# Patient Record
Sex: Male | Born: 1970 | Race: White | Hispanic: No | Marital: Married | Smoking: Former smoker
Health system: Southern US, Community
[De-identification: ages and names within clinical notes are randomized; demographics above are authoritative.]

## PROBLEM LIST (undated history)

## (undated) DIAGNOSIS — I10 Essential (primary) hypertension: Secondary | ICD-10-CM

## (undated) DIAGNOSIS — K219 Gastro-esophageal reflux disease without esophagitis: Secondary | ICD-10-CM

## (undated) HISTORY — DX: Gastro-esophageal reflux disease without esophagitis: K21.9

## (undated) HISTORY — DX: Essential (primary) hypertension: I10

## (undated) HISTORY — PX: HERNIA REPAIR: SHX51

## (undated) HISTORY — PX: GALLBLADDER SURGERY: SHX652

---

## 2019-01-22 DIAGNOSIS — Z125 Encounter for screening for malignant neoplasm of prostate: Secondary | ICD-10-CM | POA: Insufficient documentation

## 2019-01-22 DIAGNOSIS — I10 Essential (primary) hypertension: Secondary | ICD-10-CM | POA: Insufficient documentation

## 2019-02-12 DIAGNOSIS — J22 Unspecified acute lower respiratory infection: Secondary | ICD-10-CM | POA: Insufficient documentation

## 2019-08-19 NOTE — Progress Notes (Signed)
 [facility] [place] [address] [place] [address] Follow up on chronic medical problem   Ross Williams is a 48 y.o. male DOB: 12-05-1970   Subjective  [name]  presents for follow up visit on chronic medical problem  Chief Complaint  Patient presents with  . Medication Management    needs refill on BP med/ denies chest pain/sob/HA's  . left groin pain    left groin discomfort x 6 weeks, notice some swelling   HPI HYPERTENSION  No chest pains, sob, dizziness, pedal edema, palpitations.  On olmesartan  20 mg without ill side effects.   LEFT INGUINAL HERNIA  Heavy lifting at work.  Noticed lump along left inguinal region six weeks prior.   Not tender.  To touch but sometimes does feel discomfort.  Not sure if it reduces while laying down.  No testicular swelling or pain. No abdominal pain, no changes in bowel habits.   No prior issues with hernia.   Severity : Mild  Quality:  __________________________________________________________________  No past medical history on file.  Past Surgical History:  Procedure Laterality Date  . NO PAST SURGERIES      Social History   Socioeconomic History  . Marital status: Married    Spouse name: None  . Number of children: None  . Years of education: None  . Highest education level: None  Occupational History  . None  Social Needs  . Financial resource strain: None  . Food insecurity    Worry: None    Inability: None  . Transportation needs    Medical: None    Non-medical: None  Tobacco Use  . Smoking status: Current Every Day Smoker    Packs/day: 0.25    Types: Cigarettes  . Smokeless tobacco: Never Used  Substance and Sexual Activity  . Alcohol use: Never    Frequency: Never  . Drug use: Never  . Sexual activity: Yes    Partners: Female  Lifestyle  . Physical activity    Days per week: None    Minutes per session: None  . Stress: None   Relationships  . Social Musician on phone: None    Gets together: None    Attends religious service: None    Active member of club or organization: None    Attends meetings of clubs or organizations: None    Relationship status: None  Other Topics Concern  . None  Social History Narrative  . None    Family History  Adopted: Yes   ___________________________________________________________________  Review of Systems - all systems negative with the exception of what is listed in the HPI  No Known Allergies   Current Outpatient Medications:  .  olmesartan  (BENICAR ) 20 MG tablet, Take 1 tablet (20 mg total) by mouth daily., Disp: 90 tablet, Rfl: 3  Objective   Vitals:   08/19/19 0807 08/19/19 0816  BP: 118/70 118/70  Pulse: 76   Temp: 98.7 F (37.1 C)   Resp: 18   SpO2: 97%   Height: 1.727 m (5' 8)   Weight: 79.8 kg (176 lb)   BMI (Calculated): 26.8      GENERAL APPEARANCE:  Well developed, well groomed. No acute distress. Color good. MENTAL STATUS: Appears alert and oriented.  SKIN: No suspicious lesions, masses, rashes, or ulcerations.  HEAD: Normocephalic. EYES: PERRL, EOMI. Lids w/o defect, conjunctiva and sclera appear normal.  CHEST: Respirations unlabored with normal diaphragmatic excursion. Chest  wall symmetric with no masses. Breath sounds clear bilaterally w/o wheezes, rubs, rales, or rhonchi.  HEART: RRR no murmurs rubs or gallops.  ABDOMEN: Abdomen soft with normal bowel sounds. No guarding or rebound. No palpable masses or tenderness. GU: left inguinal region hernia.  Reducible and with some discomfort on palpation.  No lymphadenitis.   NEURO: Cranial nerves grossly intact.    Assessment/Plan   1. Essential hypertension    2. Left groin hernia  Ambulatory referral to General Surgery    Patient Instructions  HYPERTENSION  - bp at goal - continue current meds at current dose - watch sodium intake.   - avoid excessive caffeine  intake  - will follow up for med management in six months.  LEFT INGUINAL HERNIA  Will refer to general surgery for further evaluation    Return for follow up in six months for CPE after fasting labs.  .       Electronically signed by: Alberteen Hunter Raddle., PA-C 08/19/19 [phone]

## 2019-08-31 DIAGNOSIS — Z0000 Encounter for general adult medical examination without abnormal findings: Secondary | ICD-10-CM | POA: Insufficient documentation

## 2019-08-31 DIAGNOSIS — K4090 Unilateral inguinal hernia, without obstruction or gangrene, not specified as recurrent: Secondary | ICD-10-CM | POA: Insufficient documentation

## 2019-08-31 DIAGNOSIS — R1032 Left lower quadrant pain: Secondary | ICD-10-CM | POA: Insufficient documentation

## 2019-10-25 DIAGNOSIS — Z09 Encounter for follow-up examination after completed treatment for conditions other than malignant neoplasm: Secondary | ICD-10-CM | POA: Insufficient documentation

## 2020-02-18 NOTE — Progress Notes (Signed)
 [facility] [place] [address] [place] [address] Health maintenance  Ross Williams is a 49 y.o. male DOB: 10-Dec-1970   Subjective  Ross Williams  presents for health maintenance   Chief Complaint  Patient presents with  . Annual Exam    Declines TDAP, Colonoscopy   HPI - sleep: stable - mood: stable - diet: unrestricted  - vaccinations: declines tdap.  - exercise: not regularly  - eye exam: up to date  - dentist: up to date  - colon screening: colonoscopy.  Declined.   - prostate exam: psa up to date. Normal.   - concerns: none  __________________________________________________________________  No past medical history on file.  Past Surgical History:  Procedure Laterality Date  . HERNIA REPAIR     left groin  . NO PAST SURGERIES    . Thoracic surgery as a child for trauma      Social History   Socioeconomic History  . Marital status: Married    Spouse name: None  . Number of children: None  . Years of education: None  . Highest education level: None  Occupational History  . None  Tobacco Use  . Smoking status: Current Every Day Smoker    Packs/day: 0.25    Types: Cigarettes  . Smokeless tobacco: Never Used  Substance and Sexual Activity  . Alcohol use: Never  . Drug use: Never  . Sexual activity: Yes    Partners: Female  Other Topics Concern  . None  Social History Narrative  . None   Social Determinants of Health   Financial Resource Strain:   . Difficulty of Paying Living Expenses:   Food Insecurity:   . Worried About Programme researcher, broadcasting/film/video in the Last Year:   . Barista in the Last Year:   Transportation Needs:   . Freight forwarder (Medical):   SABRA Lack of Transportation (Non-Medical):   Physical Activity:   . Days of Exercise per Week:   . Minutes of Exercise per Session:   Stress:   . Feeling of Stress :   Social Connections:   . Frequency of Communication  with Friends and Family:   . Frequency of Social Gatherings with Friends and Family:   . Attends Religious Services:   . Active Member of Clubs or Organizations:   . Attends Banker Meetings:   SABRA Marital Status:     Family History  Adopted: Yes   ___________________________________________________________________  Review of Systems - all systems negative with the exception of what is listed in the HPI  No Known Allergies   Current Outpatient Medications:  .  ibuprofen  (ADVIL ,MOTRIN ) 600 MG tablet, TAKE 1 TABLET BY MOUTH EVERY 8 HOURS AS NEEDED FOR PAIN, Disp: 30 tablet, Rfl: 11 .  olmesartan  (BENICAR ) 20 MG tablet, Take 1 tablet (20 mg total) by mouth daily., Disp: 90 tablet, Rfl: 3  Objective   Vitals:   02/18/20 0832  BP: 148/74  Pulse: 82  Temp: 98.2 F (36.8 C)  Resp: 18  SpO2: 97%  Height: 1.727 m (5' 8)  Weight: 80.3 kg (177 lb)  BMI (Calculated): 27     GENERAL APPEARANCE:  Well developed, well groomed. No acute distress. Color good. MENTAL STATUS: Appears alert and oriented. Affect appropriate. No manic s/s.  Mood normal.   SKIN: No suspicious lesions, masses, rashes, or ulcerations.  HEAD: Normocephalic. EARS: External ear nml. External auditory canal intact, clear,  and w/o lesions. TMs intact with normal light reflex and landmarks. Acuity to conversational tones good.  EYES: PERRL, EOMI. Lids w/o defect, conjunctiva and sclera appear normal.  NOSE: Nasal mucosa and turbinates pink, no lesions. OROPHARYNX: Moist w/o exudate, erythema, or swelling.  Gums pink w/o lesions. Normal appearing mucosa, palate, and tongue.  CHEST: Respirations unlabored with normal diaphragmatic excursion. Chest wall symmetric with no masses. Breath sounds clear bilaterally w/o wheezes, rubs, rales, or rhonchi.  HEART: RRR no murmurs rubs or gallops.  ABDOMEN: Abdomen soft with normal bowel sounds. No guarding or rebound. No palpable masses or tenderness. VASC:  No  edema/ no cyanosis. NEURO: Cranial nerves grossly intact. No tremors, gait normal. PERRL, EOM intact. Alert and oriented.  No obvious motor function deficits. Normal DTR bilateral  Assessment/Plan   1. Healthcare maintenance      Patient Instructions  WELLNESS - education provided - sex/age based - vaccinations: recommendations: declines tdap  - cancer screening recommendations: colon screening declined.  psa in normal range, follow year.   QUESTIONS ANSWERED - next wellness in one year - fasting labs today: reviewed      Return in about 1 year (around 02/17/2021).       Electronically signed by: Alberteen Hunter Raddle., PA-C 02/20/20 1720

## 2020-08-07 ENCOUNTER — Emergency Department (HOSPITAL_COMMUNITY): Payer: Worker's Compensation

## 2020-08-07 ENCOUNTER — Ambulatory Visit (HOSPITAL_COMMUNITY)
Admission: EM | Admit: 2020-08-07 | Discharge: 2020-08-08 | Disposition: A | Discharge status: Home / Self Care | Payer: Worker's Compensation | Attending: Emergency Medicine | Admitting: Emergency Medicine

## 2020-08-07 ENCOUNTER — Encounter (HOSPITAL_COMMUNITY)
Admission: EM | Disposition: A | Discharge status: Home / Self Care | Payer: Self-pay | Source: Home / Self Care | Attending: Emergency Medicine

## 2020-08-07 ENCOUNTER — Emergency Department (HOSPITAL_COMMUNITY): Payer: Worker's Compensation | Admitting: Anesthesiology

## 2020-08-07 DIAGNOSIS — W3189XA Contact with other specified machinery, initial encounter: Secondary | ICD-10-CM | POA: Insufficient documentation

## 2020-08-07 DIAGNOSIS — S68622A Partial traumatic transphalangeal amputation of right middle finger, initial encounter: Secondary | ICD-10-CM | POA: Diagnosis not present

## 2020-08-07 DIAGNOSIS — Y990 Civilian activity done for income or pay: Secondary | ICD-10-CM | POA: Insufficient documentation

## 2020-08-07 DIAGNOSIS — S6991XA Unspecified injury of right wrist, hand and finger(s), initial encounter: Secondary | ICD-10-CM

## 2020-08-07 DIAGNOSIS — S68624A Partial traumatic transphalangeal amputation of right ring finger, initial encounter: Secondary | ICD-10-CM | POA: Diagnosis not present

## 2020-08-07 DIAGNOSIS — S62636B Displaced fracture of distal phalanx of right little finger, initial encounter for open fracture: Secondary | ICD-10-CM | POA: Diagnosis not present

## 2020-08-07 DIAGNOSIS — Z23 Encounter for immunization: Secondary | ICD-10-CM | POA: Diagnosis not present

## 2020-08-07 DIAGNOSIS — Z20822 Contact with and (suspected) exposure to covid-19: Secondary | ICD-10-CM | POA: Diagnosis not present

## 2020-08-07 DIAGNOSIS — Z419 Encounter for procedure for purposes other than remedying health state, unspecified: Secondary | ICD-10-CM

## 2020-08-07 DIAGNOSIS — T07XXXA Unspecified multiple injuries, initial encounter: Secondary | ICD-10-CM

## 2020-08-07 HISTORY — PX: AMPUTATION: SHX166

## 2020-08-07 HISTORY — PX: I & D EXTREMITY: SHX5045

## 2020-08-07 HISTORY — PX: FINGER REPLANTATION: SHX639

## 2020-08-07 LAB — PROTIME-INR
INR: 1 (ref 0.8–1.2)
Prothrombin Time: 13.1 seconds (ref 11.4–15.2)

## 2020-08-07 LAB — CBC WITH DIFFERENTIAL/PLATELET
Abs Immature Granulocytes: 0.03 10*3/uL (ref 0.00–0.07)
Basophils Absolute: 0.1 10*3/uL (ref 0.0–0.1)
Basophils Relative: 1 %
Eosinophils Absolute: 0 10*3/uL (ref 0.0–0.5)
Eosinophils Relative: 0 %
HCT: 44.6 % (ref 39.0–52.0)
Hemoglobin: 15.5 g/dL (ref 13.0–17.0)
Immature Granulocytes: 0 %
Lymphocytes Relative: 12 %
Lymphs Abs: 1.3 10*3/uL (ref 0.7–4.0)
MCH: 33.6 pg (ref 26.0–34.0)
MCHC: 34.8 g/dL (ref 30.0–36.0)
MCV: 96.7 fL (ref 80.0–100.0)
Monocytes Absolute: 0.7 10*3/uL (ref 0.1–1.0)
Monocytes Relative: 6 %
Neutro Abs: 8.4 10*3/uL — ABNORMAL HIGH (ref 1.7–7.7)
Neutrophils Relative %: 81 %
Platelets: 257 10*3/uL (ref 150–400)
RBC: 4.61 MIL/uL (ref 4.22–5.81)
RDW: 13.2 % (ref 11.5–15.5)
WBC: 10.4 10*3/uL (ref 4.0–10.5)
nRBC: 0 % (ref 0.0–0.2)

## 2020-08-07 LAB — BASIC METABOLIC PANEL
Anion gap: 10 (ref 5–15)
BUN: 13 mg/dL (ref 6–20)
CO2: 27 mmol/L (ref 22–32)
Calcium: 9.4 mg/dL (ref 8.9–10.3)
Chloride: 104 mmol/L (ref 98–111)
Creatinine, Ser: 1.24 mg/dL (ref 0.61–1.24)
GFR calc Af Amer: >60 mL/min (ref >60)
GFR calc non Af Amer: >60 mL/min (ref >60)
Glucose, Bld: 103 mg/dL — ABNORMAL HIGH (ref 70–99)
Potassium: 4.2 mmol/L (ref 3.5–5.1)
Sodium: 141 mmol/L (ref 135–145)

## 2020-08-07 LAB — SARS CORONAVIRUS 2 BY RT PCR (HOSPITAL ORDER, PERFORMED IN ~~LOC~~ HOSPITAL LAB): SARS Coronavirus 2: NEGATIVE

## 2020-08-07 LAB — TYPE AND SCREEN
ABO/RH(D): B POS
Antibody Screen: NEGATIVE

## 2020-08-07 SURGERY — IRRIGATION AND DEBRIDEMENT EXTREMITY
Anesthesia: General | Site: Hand | Laterality: Right

## 2020-08-07 MED ORDER — SODIUM CHLORIDE 0.9 % IR SOLN
Status: DC | PRN
  Administered 2020-08-07: 3000 mL

## 2020-08-07 MED ORDER — 0.9 % SODIUM CHLORIDE (POUR BTL) OPTIME
TOPICAL | Status: DC | PRN
  Administered 2020-08-07: 1000 mL

## 2020-08-07 MED ORDER — GENTAMICIN IN SALINE 1.6-0.9 MG/ML-% IV SOLN
80.0000 mg | INTRAVENOUS | Status: AC
  Administered 2020-08-07: 80 mg via INTRAVENOUS
  Filled 2020-08-07: qty 50

## 2020-08-07 MED ORDER — FENTANYL CITRATE (PF) 250 MCG/5ML IJ SOLN
INTRAMUSCULAR | Status: DC | PRN
  Administered 2020-08-07: 50 ug via INTRAVENOUS
  Administered 2020-08-07 (×2): 100 ug via INTRAVENOUS

## 2020-08-07 MED ORDER — SODIUM CHLORIDE 0.9 % IV BOLUS
500.0000 mL | Freq: Once | INTRAVENOUS | Status: AC
  Administered 2020-08-07: 500 mL via INTRAVENOUS

## 2020-08-07 MED ORDER — LACTATED RINGERS IV SOLN: INTRAVENOUS | Status: DC | PRN

## 2020-08-07 MED ORDER — TETANUS-DIPHTH-ACELL PERTUSSIS 5-2.5-18.5 LF-MCG/0.5 IM SUSP
0.5000 mL | Freq: Once | INTRAMUSCULAR | Status: AC
  Administered 2020-08-07: 0.5 mL via INTRAMUSCULAR
  Filled 2020-08-07: qty 0.5

## 2020-08-07 MED ORDER — BUPIVACAINE-EPINEPHRINE 0.5% -1:200000 IJ SOLN
INTRAMUSCULAR | Status: AC
  Filled 2020-08-07: qty 1

## 2020-08-07 MED ORDER — ONDANSETRON HCL 4 MG/2ML IJ SOLN
4.0000 mg | Freq: Once | INTRAMUSCULAR | Status: AC
  Administered 2020-08-07: 4 mg via INTRAVENOUS
  Filled 2020-08-07: qty 2

## 2020-08-07 MED ORDER — BUPIVACAINE HCL (PF) 0.25 % IJ SOLN
INTRAMUSCULAR | Status: AC
  Filled 2020-08-07: qty 30

## 2020-08-07 MED ORDER — BUPIVACAINE HCL (PF) 0.5 % IJ SOLN
INTRAMUSCULAR | Status: AC
  Filled 2020-08-07: qty 30

## 2020-08-07 MED ORDER — METRONIDAZOLE IN NACL 5-0.79 MG/ML-% IV SOLN
500.0000 mg | Freq: Once | INTRAVENOUS | Status: AC
  Administered 2020-08-07: 500 mg via INTRAVENOUS
  Filled 2020-08-07: qty 100

## 2020-08-07 MED ORDER — FENTANYL CITRATE (PF) 250 MCG/5ML IJ SOLN
INTRAMUSCULAR | Status: AC
  Filled 2020-08-07: qty 5

## 2020-08-07 MED ORDER — CEFAZOLIN SODIUM-DEXTROSE 2-4 GM/100ML-% IV SOLN
2.0000 g | Freq: Once | INTRAVENOUS | Status: AC
  Administered 2020-08-07: 2 g via INTRAVENOUS
  Filled 2020-08-07: qty 100

## 2020-08-07 MED ORDER — MIDAZOLAM HCL 2 MG/2ML IJ SOLN
INTRAMUSCULAR | Status: AC
  Filled 2020-08-07: qty 2

## 2020-08-07 MED ORDER — STERILE WATER FOR IRRIGATION IR SOLN
Status: DC | PRN
  Administered 2020-08-07: 1000 mL

## 2020-08-07 MED ORDER — MIDAZOLAM HCL 5 MG/5ML IJ SOLN
INTRAMUSCULAR | Status: DC | PRN
  Administered 2020-08-07: 2 mg via INTRAVENOUS

## 2020-08-07 MED ORDER — PROPOFOL 1000 MG/100ML IV EMUL
INTRAVENOUS | Status: AC
  Filled 2020-08-07: qty 100

## 2020-08-07 MED ORDER — PROPOFOL 10 MG/ML IV BOLUS
INTRAVENOUS | Status: DC | PRN
  Administered 2020-08-07: 200 mg via INTRAVENOUS

## 2020-08-07 MED ORDER — LIDOCAINE HCL (CARDIAC) PF 100 MG/5ML IV SOSY
PREFILLED_SYRINGE | INTRAVENOUS | Status: DC | PRN
  Administered 2020-08-07: 60 mg via INTRATRACHEAL

## 2020-08-07 MED ORDER — EPHEDRINE SULFATE 50 MG/ML IJ SOLN
INTRAMUSCULAR | Status: DC | PRN
  Administered 2020-08-07: 5 mg via INTRAVENOUS
  Administered 2020-08-07: 10 mg via INTRAVENOUS
  Administered 2020-08-07: 5 mg via INTRAVENOUS

## 2020-08-07 MED ORDER — MORPHINE SULFATE (PF) 4 MG/ML IV SOLN
4.0000 mg | Freq: Once | INTRAVENOUS | Status: AC
  Administered 2020-08-07: 4 mg via INTRAVENOUS
  Filled 2020-08-07: qty 1

## 2020-08-07 MED ORDER — PROPOFOL 10 MG/ML IV BOLUS
INTRAVENOUS | Status: AC
  Filled 2020-08-07: qty 20

## 2020-08-07 MED ORDER — PHENYLEPHRINE HCL (PRESSORS) 10 MG/ML IV SOLN
INTRAVENOUS | Status: DC | PRN
  Administered 2020-08-07 – 2020-08-08 (×2): 40 ug via INTRAVENOUS

## 2020-08-07 MED ORDER — SUCCINYLCHOLINE CHLORIDE 20 MG/ML IJ SOLN
INTRAMUSCULAR | Status: DC | PRN
  Administered 2020-08-07: 120 mg via INTRAVENOUS

## 2020-08-07 MED ORDER — HYDROMORPHONE HCL 1 MG/ML IJ SOLN
1.0000 mg | INTRAMUSCULAR | Status: DC | PRN
  Administered 2020-08-07: 1 mg via INTRAVENOUS
  Filled 2020-08-07: qty 1

## 2020-08-07 SURGICAL SUPPLY — 32 items
BLADE SURG 15 STRL LF DISP TIS (BLADE) ×2 IMPLANT
BLADE SURG 15 STRL SS (BLADE) ×2
BNDG COHESIVE 4X5 TAN STRL (GAUZE/BANDAGES/DRESSINGS) ×4 IMPLANT
BNDG GAUZE ELAST 4 BULKY (GAUZE/BANDAGES/DRESSINGS) ×8 IMPLANT
CORD BIPOLAR FORCEPS 12FT (ELECTRODE) ×4 IMPLANT
COVER WAND RF STERILE (DRAPES) ×4 IMPLANT
DRAPE SURG 17X23 STRL (DRAPES) ×4 IMPLANT
GAUZE SPONGE 4X4 12PLY STRL (GAUZE/BANDAGES/DRESSINGS) ×4 IMPLANT
GAUZE XEROFORM 1X8 LF (GAUZE/BANDAGES/DRESSINGS) ×4 IMPLANT
GLOVE BIO SURGEON STRL SZ7.5 (GLOVE) ×4 IMPLANT
GLOVE BIOGEL PI IND STRL 7.0 (GLOVE) ×2 IMPLANT
GLOVE BIOGEL PI IND STRL 8 (GLOVE) ×2 IMPLANT
GLOVE BIOGEL PI INDICATOR 7.0 (GLOVE) ×2
GLOVE BIOGEL PI INDICATOR 8 (GLOVE) ×2
GLOVE ECLIPSE 6.5 STRL STRAW (GLOVE) ×4 IMPLANT
GOWN STRL REUS W/ TWL LRG LVL3 (GOWN DISPOSABLE) ×4 IMPLANT
GOWN STRL REUS W/ TWL XL LVL3 (GOWN DISPOSABLE) ×2 IMPLANT
GOWN STRL REUS W/TWL LRG LVL3 (GOWN DISPOSABLE) ×4
GOWN STRL REUS W/TWL XL LVL3 (GOWN DISPOSABLE) ×2
K-WIRE DBL TROCAR .045X4 (WIRE) ×4
KIT BASIN OR (CUSTOM PROCEDURE TRAY) ×4 IMPLANT
KWIRE DBL TROCAR .045X4 (WIRE) ×2 IMPLANT
NS IRRIG 1000ML POUR BTL (IV SOLUTION) ×4 IMPLANT
PACK ORTHO EXTREMITY (CUSTOM PROCEDURE TRAY) ×4 IMPLANT
PAD CAST 4YDX4 CTTN HI CHSV (CAST SUPPLIES) ×4 IMPLANT
PADDING CAST COTTON 4X4 STRL (CAST SUPPLIES) ×4
SET IRRIG Y TYPE TUR BLADDER L (SET/KITS/TRAYS/PACK) ×4 IMPLANT
SOL PREP PROV IODINE SCRUB 4OZ (MISCELLANEOUS) ×4 IMPLANT
STOCKINETTE 6  STRL (DRAPES) ×2
STOCKINETTE 6 STRL (DRAPES) ×2 IMPLANT
SUT VICRYL RAPIDE 4/0 PS 2 (SUTURE) ×12 IMPLANT
UNDERPAD 30X36 HEAVY ABSORB (UNDERPADS AND DIAPERS) ×4 IMPLANT

## 2020-08-07 NOTE — ED Triage Notes (Signed)
Pt transported from work by [employer], pt sustained injury to R hand by equipment in [employer]. Partial degloving to ring and middle finger deep lacs to index finger. EMS applied bandage, bleeding controlled. No Tdap

## 2020-08-07 NOTE — Consult Note (Signed)
ORTHOPAEDIC CONSULTATION HISTORY & PHYSICAL REQUESTING PHYSICIAN: No att. providers found  Chief Complaint: right hand trauma  HPI: Ross Williams is a 49 y.o. 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  He was transported via EMS to [facility] ED where evaluation has begun.  Clinical photos had been placed in the chart and x-rays obtained.  PMHx- HTN PSHx-hernia rpr  Social History   Socioeconomic History   Marital status: Married    Spouse name: Not on file   Number of children: Not on file   Years of education: Not on file   Highest education level: Not on file  Occupational History   Not on file  Tobacco Use   Smoking status: Not on file  Substance and Sexual Activity   Alcohol use: Not on file   Drug use: Not on file   Sexual activity: Not on file  Other Topics Concern   Not on file  Social History Narrative   Not on file   Social Determinants of Health   Financial Resource Strain:    Difficulty of Paying Living Expenses: Not on file  Food Insecurity:    Worried About Running Out of Food in the Last Year: Not on file   Ran Out of Food in the Last Year: Not on file  Transportation Needs:    Lack of Transportation (Medical): Not on file   Lack of Transportation (Non-Medical): Not on file  Physical Activity:    Days of Exercise per Week: Not on file   Minutes of Exercise per Session: Not on file  Stress:    Feeling of Stress : Not on file  Social Connections:    Frequency of Communication with Friends and Family: Not on file   Frequency of Social Gatherings with Friends and Family: Not on file   Attends Religious Services: Not on file   Active Member of Clubs or Organizations: Not on file   Attends Banker Meetings: Not on file   Marital Status: Not on file   No family history on file. Not on File Prior to Admission medications   Not on  File   DG Hand Complete Right  Result Date: 08/07/2020 CLINICAL DATA:  Patient got there arm caught in a conveyor belt. EXAM: RIGHT HAND - COMPLETE 3+ VIEW COMPARISON:  None. FINDINGS: There are comminuted open fractures of the third digit distal phalanx which extends to the distal interphalangeal joint, fourth digit distal and middle phalanges which extend to the distal interphalangeal joint, and the fifth digit distal phalanx which does not extend to the joint space. There appears to be a wound of the distal second digit, however there is no definite fracture of the second digit. No definite radiopaque foreign bodies within the soft tissues of the hand. IMPRESSION: 1. Comminuted open fractures of the third, fourth, and fifth digits. Electronically Signed   By: Romona Curls M.D.   On: 08/07/2020 20:01     Positive ROS: All other systems have been reviewed and were otherwise negative with the exception of those mentioned in the HPI and as above.  Physical Exam: Vitals: Refer to EMR. Constitutional:  WD, WN, NAD HEENT:  NCAT, EOMI Neuro/Psych:  Alert & oriented to person, place, and time; appropriate mood & affect Lymphatic: No generalized extremity edema or lymphadenopathy Extremities / MSK:  The extremities are normal with respect to appearance,  ranges of motion, joint stability, muscle strength/tone, sensation, & perfusion except as otherwise noted:  There are a mangling injuries to the tips of the right long, ring, and small fingers, with near complete amputations of the long and ring.  The small finger appears injured to a lesser degree.  At the bedside there are bandages in place and it is not further examined as it will be examined in great detail under the appropriate degree of anesthesia  Assessment: Mangling injury to R LF/RF/SF tips with underlying comminuted fxs from workplace injury  Plan: To OR for revision amputation versus fingertip salvage of right LF, RF, & SF, depending upon  findings at operative exploration.  G/R/O reviewed and consent obtained and document executed.  Cliffton Asters Janee Morn, MD      Orthopaedic & Hand Surgery [facility] Sports Medicine [facility] [address] [place], [place]  [address] Office: [phone] Mobile: [phone]  08/07/2020, 10:58 PM

## 2020-08-07 NOTE — Discharge Instructions (Signed)
Discharge Instructions ° ° °You have a dressing with a splint incorporated in it. °Move your fingers as much as possible, making a full fist and fully opening the fist. °Elevate your hand to reduce pain & swelling of the digits.  Ice over the operative site may be helpful to reduce pain & swelling.  DO NOT USE HEAT. °Pain medicine has been prescribed for you.  °Leave the dressing in place until you return to our office.  °You may shower, but keep the bandage clean & dry.  °You may drive a car when you are off of prescription pain medications and can safely control your vehicle with both hands. °Our office will call you to arrange follow-up ° ° °Please call [phone] during normal business hours or [phone] after hours for any problems. Including the following: ° °- excessive redness of the incisions °- drainage for more than 4 days °- fever of more than 101.5 F ° °*Please note that pain medications will not be refilled after hours or on weekends. ° ° ° ° °

## 2020-08-07 NOTE — Anesthesia Preprocedure Evaluation (Addendum)
Anesthesia Evaluation  Patient identified by MRN, date of birth, ID band Patient awake    Reviewed: Allergy & Precautions, NPO status , Patient's Chart, lab work & pertinent test results  Airway Mallampati: II  TM Distance: >3 FB Neck ROM: Full    Dental  (+) Teeth Intact, Poor Dentition, Dental Advisory Given,    Pulmonary    breath sounds clear to auscultation       Cardiovascular  Rhythm:Regular Rate:Normal     Neuro/Psych    GI/Hepatic   Endo/Other    Renal/GU      Musculoskeletal   Abdominal   Peds  Hematology   Anesthesia Other Findings   Reproductive/Obstetrics                            Anesthesia Physical Anesthesia Plan  ASA: III and emergent  Anesthesia Plan: General   Post-op Pain Management:    Induction: Intravenous, Rapid sequence and Cricoid pressure planned  PONV Risk Score and Plan: Ondansetron and Dexamethasone  Airway Management Planned: Oral ETT  Additional Equipment:   Intra-op Plan:   Post-operative Plan: Extubation in OR  Informed Consent: I have reviewed the patients History and Physical, chart, labs and discussed the procedure including the risks, benefits and alternatives for the proposed anesthesia with the patient or authorized representative who has indicated his/her understanding and acceptance.     Dental advisory given  Plan Discussed with: CRNA, Anesthesiologist and Surgeon  Anesthesia Plan Comments:        Anesthesia Quick Evaluation

## 2020-08-07 NOTE — ED Provider Notes (Signed)
Emergency Department Provider Note   I have reviewed the triage vital signs and the nursing notes.   HISTORY  Chief Complaint Hand Pain   HPI Ross Williams is a 49 y.o. male, right hand dominant, presents to the emergency department via [facility] from work with a injury to his right hand.  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  He states that an egg from the conveyor belt fell and when he went to catch it his hand got stuck into the conveyor belt.  He was able to remove it but significant injury was noted to multiple fingers on his dominant hand and he was transported by EMS here for evaluation.  He denies other injury.  He states his last tetanus shot was many years ago.  He has not anything to eat or drink since yesterday.  He is not experiencing any URI or other Covid symptoms.  No allergies.  Pain is severe and worse with movement. No radiation of symptoms.    No past medical history on file.  There are no problems to display for this patient.  Allergies Patient has no allergy information on record.  No family history on file.  Social History Social History   Tobacco Use  . Smoking status: Not on file  Substance Use Topics  . Alcohol use: Not on file  . Drug use: Not on file    Review of Systems  Constitutional: No fever/chills Eyes: No visual changes. ENT: No sore throat. Cardiovascular: Denies chest pain. Respiratory: Denies shortness of breath. Gastrointestinal: No abdominal pain.  Musculoskeletal: Negative for back pain. Severe right hand pain.  Skin: Right hand lacerations and bleeding.  Neurological: Negative for headaches, focal weakness or numbness.  10-point ROS otherwise negative.  ____________________________________________   PHYSICAL EXAM:  VITAL SIGNS: ED Triage Vitals  Enc Vitals Group     BP 08/07/20 2007 118/81     Pulse Rate 08/07/20 2007 69     Resp 08/07/20 2007 18     Temp 08/07/20  2007 98.9 F (37.2 C)     Temp Source 08/07/20 2007 Oral     SpO2 08/07/20 1924 96 %   Constitutional: Alert and oriented. Well appearing and in no acute distress. Eyes: Conjunctivae are normal.  Head: Atraumatic. Nose: No congestion/rhinnorhea. Mouth/Throat: Mucous membranes are moist.   Neck: No stridor.   Cardiovascular: Normal rate, regular rhythm.  Grossly normal heart sounds.   Respiratory: Normal respiratory effort.  Gastrointestinal: No distention.  Musculoskeletal: Multiple open fractures to the right hand over the third, fourth, fifth digits.  The distal phalanx of the third and fourth digits are partially amputated.  There is visible bone and tendon with poor capillary refill in his fingertips Neurologic:  Normal speech and language. No gross focal neurologic deficits are appreciated.  Skin:  Skin is warm and dry. Laceration to the right hand as below.        ____________________________________________   LABS (all labs ordered are listed, but only abnormal results are displayed)  Labs Reviewed  BASIC METABOLIC PANEL - Abnormal; Notable for the following components:      Result Value   Glucose, Bld 103 (*)    All other components within normal limits  CBC WITH DIFFERENTIAL/PLATELET - Abnormal; Notable for the following components:   Neutro Abs 8.4 (*)    All other components within normal limits  SARS CORONAVIRUS 2 BY RT PCR (HOSPITAL ORDER, PERFORMED IN [facility])  PROTIME-INR  TYPE AND SCREEN  ABO/RH   ____________________________________________  RADIOLOGY  DG Hand Complete Right  Result Date: 08/07/2020 CLINICAL DATA:  Patient got there arm caught in a conveyor belt. EXAM: RIGHT HAND - COMPLETE 3+ VIEW COMPARISON:  None. FINDINGS: There are comminuted open fractures of the third digit distal phalanx which extends to the distal interphalangeal joint, fourth digit distal and middle phalanges which extend to the distal interphalangeal joint,  and the fifth digit distal phalanx which does not extend to the joint space. There appears to be a wound of the distal second digit, however there is no definite fracture of the second digit. No definite radiopaque foreign bodies within the soft tissues of the hand. IMPRESSION: 1. Comminuted open fractures of the third, fourth, and fifth digits. Electronically Signed   By: Romona Curls M.D.   On: 08/07/2020 20:01    ____________________________________________   PROCEDURES  Procedure(s) performed:   Procedures  CRITICAL CARE Performed by: Maia Plan Total critical care time: 35 minutes Critical care time was exclusive of separately billable procedures and treating other patients. Critical care was necessary to treat or prevent imminent or life-threatening deterioration. Critical care was time spent personally by me on the following activities: development of treatment plan with patient and/or surrogate as well as nursing, discussions with consultants, evaluation of patient's response to treatment, examination of patient, obtaining history from patient or surrogate, ordering and performing treatments and interventions, ordering and review of laboratory studies, ordering and review of radiographic studies, pulse oximetry and re-evaluation of patient's condition.  Alona Bene, MD Emergency Medicine  ____________________________________________   INITIAL IMPRESSION / ASSESSMENT AND PLAN / ED COURSE  Pertinent labs & imaging results that were available during my care of the patient were reviewed by me and considered in my medical decision making (see chart for details).   Patient arrives by EMS after accident at work.  He has fractures with macerated wounds to the third, fourth, fifth digits.  Have ordered Covid testing as well as Ancef, tetanus, fluids along with pain medicines.  Ordered preop labs after my evaluation and have paged hand surgery for consultation.   Spoke with Dr.  Janee Morn regarding injury and exam. Will be in to evaluate and take patient to the OR. Labs pending including COVID swab which has been sent. Updated patient and wife by phone. Patient may be stable to d/c after surgery. His son is on the way to the hospital to drive him howe post-op.   Discussed patient's case with Hand Surgery, Dr. Janee Morn to request admission. Patient and family (if present) updated with plan.  I reviewed all nursing notes, vitals, pertinent old records, EKGs, labs, imaging (as available).  ____________________________________________  FINAL CLINICAL IMPRESSION(S) / ED DIAGNOSES  Final diagnoses:  Multiple open fractures  Injury of right hand, initial encounter     MEDICATIONS GIVEN DURING THIS VISIT:  Medications  ceFAZolin (ANCEF) IVPB 2g/100 mL premix (2 g Intravenous New Bag/Given 08/07/20 2054)  morphine 4 MG/ML injection 4 mg (4 mg Intravenous Given 08/07/20 2055)  ondansetron (ZOFRAN) injection 4 mg (4 mg Intravenous Given 08/07/20 2055)  sodium chloride 0.9 % bolus 500 mL (500 mLs Intravenous New Bag/Given 08/07/20 2042)  Tdap (BOOSTRIX) injection 0.5 mL (0.5 mLs Intramuscular Given 08/07/20 2054)    Note:  This document was prepared using Dragon voice recognition software and may include unintentional dictation errors.  Alona Bene, MD, [facility] Emergency Medicine    Ledon Weihe, Arlyss Repress,  MD 08/07/20 2236

## 2020-08-07 NOTE — Anesthesia Procedure Notes (Signed)
Procedure Name: Intubation Date/Time: 08/07/2020 11:18 PM Performed by: Claudina Lick, CRNA Pre-anesthesia Checklist: Patient identified, Emergency Drugs available, Suction available, Patient being monitored and Timeout performed Patient Re-evaluated:Patient Re-evaluated prior to induction Oxygen Delivery Method: Circle system utilized Preoxygenation: Pre-oxygenation with 100% oxygen Induction Type: IV induction, Rapid sequence and Cricoid Pressure applied Laryngoscope Size: Miller and 2 Grade View: Grade I Tube type: Oral Tube size: 7.5 mm Number of attempts: 1 Airway Equipment and Method: Stylet Placement Confirmation: ETT inserted through vocal cords under direct vision,  positive ETCO2 and breath sounds checked- equal and bilateral Secured at: 22 cm Tube secured with: Tape Dental Injury: Teeth and Oropharynx as per pre-operative assessment

## 2020-08-07 NOTE — Op Note (Signed)
08/07/2020  11:04 PM  PATIENT:  Ross Williams  49 y.o. male  PRE-OPERATIVE DIAGNOSIS:  Mangling injuries predominantly to R LF & RF  POST-OPERATIVE DIAGNOSIS:  Same  PROCEDURE:   1. R LF excisional debridement S/SQ/T/Bone associated with open fx    2. Open treatment with pinning of R LF DIP dislocation    3. Complex closure of R LF traumatic wound, 10cm    4. R RF amputation through middle phalanx with complex soft-tissue rearrangement  SURGEON: Cliffton Asters. Janee Morn, MD  PHYSICIAN ASSISTANT: Danielle Rankin, OPA-C  ANESTHESIA:  general  SPECIMENS:  None  DRAINS:   None  EBL:  less than 50 mL  PREOPERATIVE INDICATIONS:  Ross Williams is a  49 y.o. male with a mangling injury to the tips of the right long and ring fingers from a conveyor belt accident  The risks benefits and alternatives were discussed with the patient preoperatively including but not limited to the risks of infection, bleeding, nerve injury, cardiopulmonary complications, the need for revision surgery, among others, and the patient verbalized understanding and consented to proceed.  OPERATIVE IMPLANTS: 0.045 inch K wire x1  OPERATIVE PROCEDURE:  After receiving prophylactic antibiotics in the emergency department, the patient was escorted to the operative theatre and placed in a supine position.  Additional gentamicin and Flagyl was administered.  General anesthesia was administered.  A surgical "time-out" was performed during which the planned procedure, proposed operative site, and the correct patient identity were compared to the operative consent and agreement confirmed by the circulating nurse according to current facility policy.  Following application of a tourniquet to the operative extremity, the exposed skin was prescrubbed with Hibiclens scrub brush before being formally prepped with Betadine and draped in the usual sterile fashion.  The tourniquet was actually never inflated.  Once prepped and  draped, the extent of the injuries were evaluated.  They were copiously irrigated with running irrigant using cystoscopy tubing.  Some further debridement of jagged nonviable skin edges was performed for both fingers.  This was excisional debridement with forceps and scissors.  Attention was then directed to the long finger.    On the long finger, there was pulp avulsion, with the pulp remaining just on a tether of a single digital nerve.  It was cool and pale.  A portion of the sterile matrix was on the fragment, which was removed and placed on the back table in case it could be useful in someway during reconstruction.  After removal of the pulp and a small bit of tuft within it, the residual distal phalanx was debrided with a rondure so that the nailbed had bony support, but that the available soft tissues for closure could cover the skeletal stump.  The DIP joint was found to be unstable, as both collaterals has been injured.  A 0.045 inch K wire was then driven from the tip of the distal phalanx with the DIP joint reduced but slightly flexed, across the DIP joint exiting the dorsal surface of the middle phalanx.  It was then withdrawn in that direction so that none of it was protruding from the raw naked end of the distal phalanx and it was trimmed to fall beneath the skin.  In essence it was buried at this point.  Attention was then shifted to the ring finger, where the avulsion was more proximal, containing most of the distal phalanx and the avulsed part, but still without sufficient soft tissue bridging for viability and still distal to  the zone for digital repair.  This was also removed and placed on the back table.  With a small bit of distal phalanx still remaining and comminuted fractures to the distal aspect of the middle phalanx, there was insufficient soft tissues to provide coverage.  Decision was made to shorten the skeleton to the neck of the middle phalanx and this was done sharply with a  scalpel.  The FDP was also transected and allowed to retract proximally.  It was then irrigated and attention shifted to some additional debridement with scissors and forceps to remove some specks of dirt within the wound.  Once the debridement was performed, both fingers had the complex soft tissue wounds closed.  On the ring finger, he required some rearrangement of the soft tissues present to provide coverage over the amputation stump.  The long finger with complex closure as well, about 10 cm in total.  The very distal aspect had no dermal coverage, but some viable appearing adipose and pulp tissue that provided for coverage directly over the distal aspect of the distal phalanx, hoping to serve as a good granulation base at the tip.  It was insufficient to do acute skin grafting with the spare parts.  Half percent plain Marcaine was instilled at the wrist for both the median and ulnar nerve blocks, as well as digital blocks at the bases of the fingers.  A bulky dressing was applied, with Xeroform directly on the traumatic wounds and he was awakened and taken to the recovery room in stable condition, breathing spontaneously.  DISPOSITION: He will be discharged with appropriate antibiotic plan, analgesic plan and my office will contact him to gain additional Worker's Compensation information and plan for follow-up, likely next week.  No x-rays are required at that visit, but we will plan for follow-on hand therapy appointment to begin formal motion exercises.

## 2020-08-08 ENCOUNTER — Encounter (HOSPITAL_COMMUNITY): Payer: Self-pay | Admitting: Orthopedic Surgery

## 2020-08-08 MED ORDER — FENTANYL CITRATE (PF) 100 MCG/2ML IJ SOLN: 25.0000 ug | INTRAMUSCULAR | Status: DC | PRN

## 2020-08-08 MED ORDER — IBUPROFEN 200 MG PO TABS: 600.0000 mg | ORAL_TABLET | Freq: Four times a day (QID) | ORAL | 0 refills | Status: AC

## 2020-08-08 MED ORDER — AMOXICILLIN-POT CLAVULANATE 875-125 MG PO TABS: 1.0000 | ORAL_TABLET | Freq: Two times a day (BID) | ORAL | 0 refills | Status: AC

## 2020-08-08 MED ORDER — METRONIDAZOLE 500 MG PO TABS: 500.0000 mg | ORAL_TABLET | Freq: Three times a day (TID) | ORAL | 0 refills | Status: AC

## 2020-08-08 MED ORDER — BUPIVACAINE-EPINEPHRINE (PF) 0.5% -1:200000 IJ SOLN
INTRAMUSCULAR | Status: DC | PRN
  Administered 2020-08-08: 20 mL

## 2020-08-08 MED ORDER — OXYCODONE HCL 5 MG/5ML PO SOLN: 5.0000 mg | Freq: Once | ORAL | Status: DC | PRN

## 2020-08-08 MED ORDER — ACETAMINOPHEN 325 MG PO TABS: 650.0000 mg | ORAL_TABLET | Freq: Four times a day (QID) | ORAL | 0 refills | Status: AC

## 2020-08-08 MED ORDER — GABAPENTIN 300 MG PO CAPS: 300.0000 mg | ORAL_CAPSULE | Freq: Three times a day (TID) | ORAL | 0 refills | Status: DC

## 2020-08-08 MED ORDER — OXYCODONE HCL 5 MG PO TABS: 5.0000 mg | ORAL_TABLET | Freq: Once | ORAL | Status: DC | PRN

## 2020-08-08 MED ORDER — OXYCODONE HCL 5 MG PO TABS: 5.0000 mg | ORAL_TABLET | Freq: Four times a day (QID) | ORAL | 0 refills | Status: DC | PRN

## 2020-08-08 MED ORDER — ONDANSETRON HCL 4 MG/2ML IJ SOLN: 4.0000 mg | Freq: Once | INTRAMUSCULAR | Status: DC | PRN

## 2020-08-08 NOTE — Transfer of Care (Signed)
Immediate Anesthesia Transfer of Care Note  Patient: Ross Williams  Procedure(s) Performed: IRRIGATION AND DEBRIDEMENT OF RIGHT HAND FINGERS (Right Hand) REVISION AMPUTATION OF LONG AND RING FINGERS (Right Hand) OPEN TREATMENT OF LONG FINGER DISLOCATION (Right Finger)  Patient Location: PACU  Anesthesia Type:General  Level of Consciousness: drowsy  Airway & Oxygen Therapy: Patient Spontanous Breathing and Patient connected to face mask oxygen  Post-op Assessment: Report given to RN and Post -op Vital signs reviewed and stable  Post vital signs: Reviewed and stable  Last Vitals:  Vitals Value Taken Time  BP 123/64 08/08/20 0030  Temp    Pulse 106 08/08/20 0030  Resp    SpO2 98 % 08/08/20 0030  Vitals shown include unvalidated device data.  Last Pain:  Vitals:   08/07/20 2219  TempSrc:   PainSc: 9          Complications: No complications documented.

## 2020-08-09 NOTE — Anesthesia Postprocedure Evaluation (Signed)
Anesthesia Post Note  Patient: Ross Williams  Procedure(s) Performed: IRRIGATION AND DEBRIDEMENT OF RIGHT HAND FINGERS (Right Hand) REVISION AMPUTATION OF LONG AND RING FINGERS (Right Hand) OPEN TREATMENT OF LONG FINGER DISLOCATION (Right Finger)     Patient location during evaluation: PACU Anesthesia Type: General Level of consciousness: awake and alert Pain management: pain level controlled Vital Signs Assessment: post-procedure vital signs reviewed and stable Respiratory status: spontaneous breathing, nonlabored ventilation, respiratory function stable and patient connected to nasal cannula oxygen Cardiovascular status: blood pressure returned to baseline and stable Postop Assessment: no apparent nausea or vomiting Anesthetic complications: no   No complications documented.  Last Vitals:  Vitals:   08/08/20 0044 08/08/20 0100  BP: 125/64 129/74  Pulse: (!) 112 (!) 112  Resp: 20 20  Temp:  36.4 C  SpO2: 98% 96%    Last Pain:  Vitals:   08/08/20 0100  TempSrc:   PainSc: 0-No pain                 Shaqueta Casady COKER

## 2021-04-30 LAB — HM COLONOSCOPY

## 2021-05-29 IMAGING — CR DG HAND COMPLETE 3+V*R*
4 series · 4 of 4 positions shown · non-contrast
Comparison: None.

CLINICAL DATA: Patient got there arm caught in a conveyor belt.

EXAM:
RIGHT HAND - COMPLETE 3+ VIEW

[hand pa (1 of 2)]
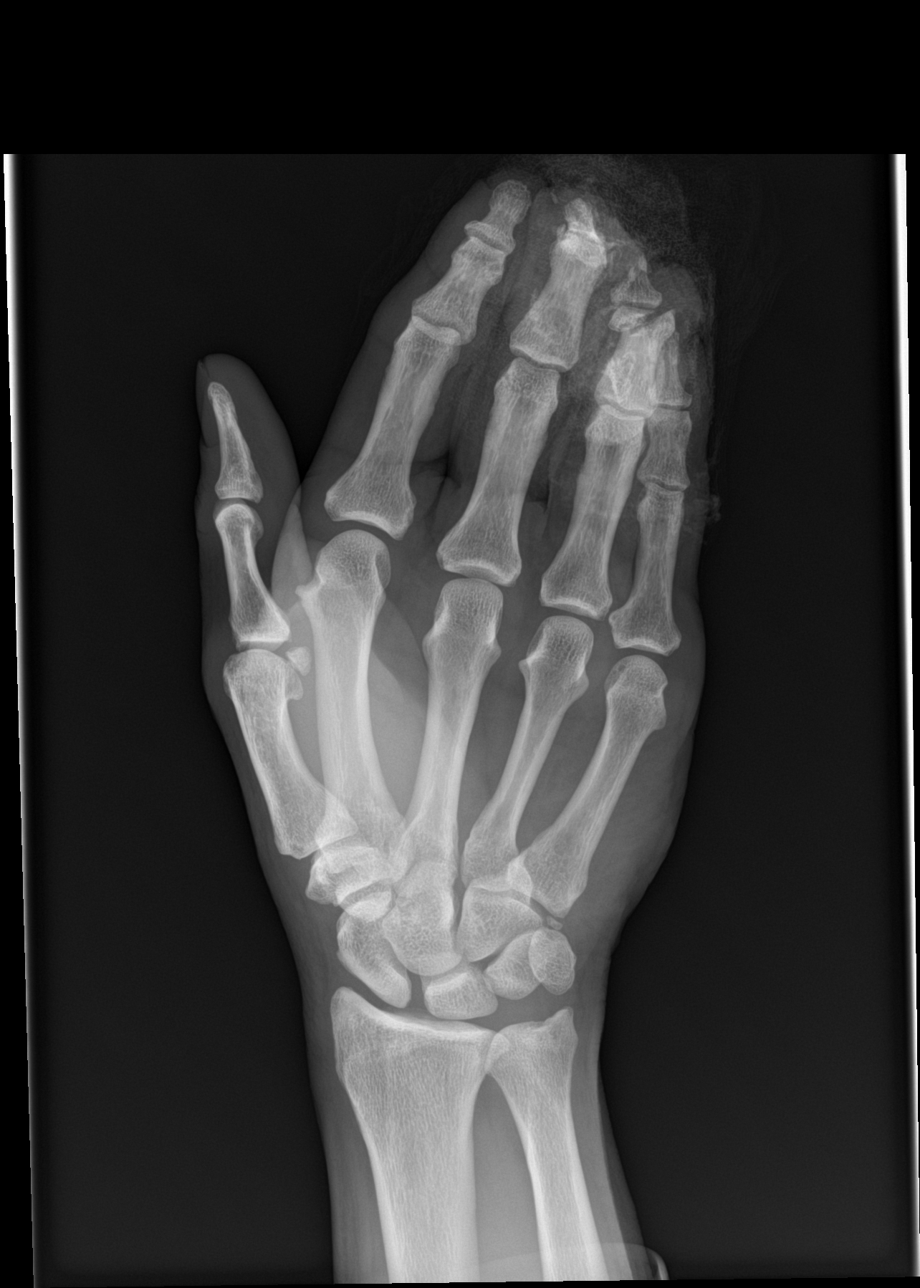

[hand lat]
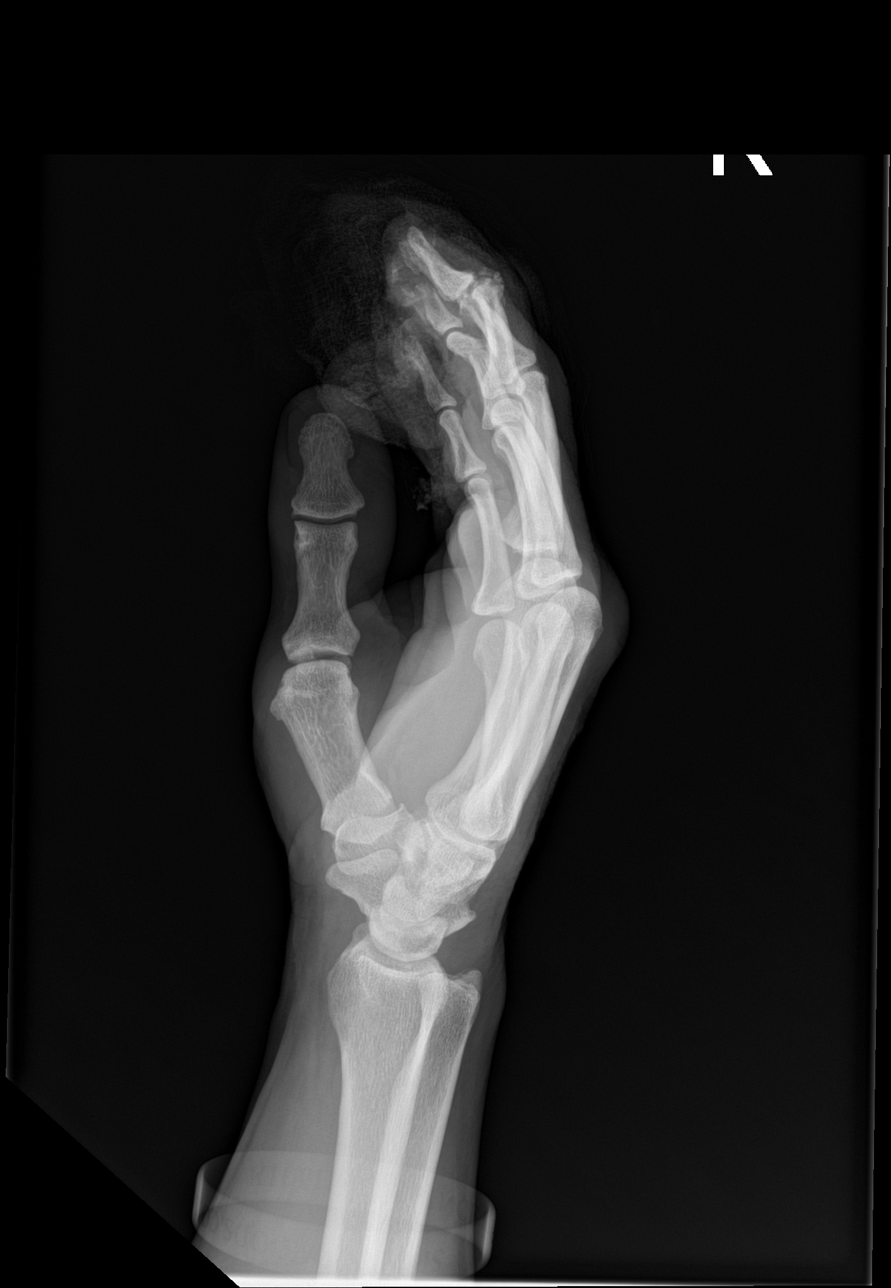

[hand obl]
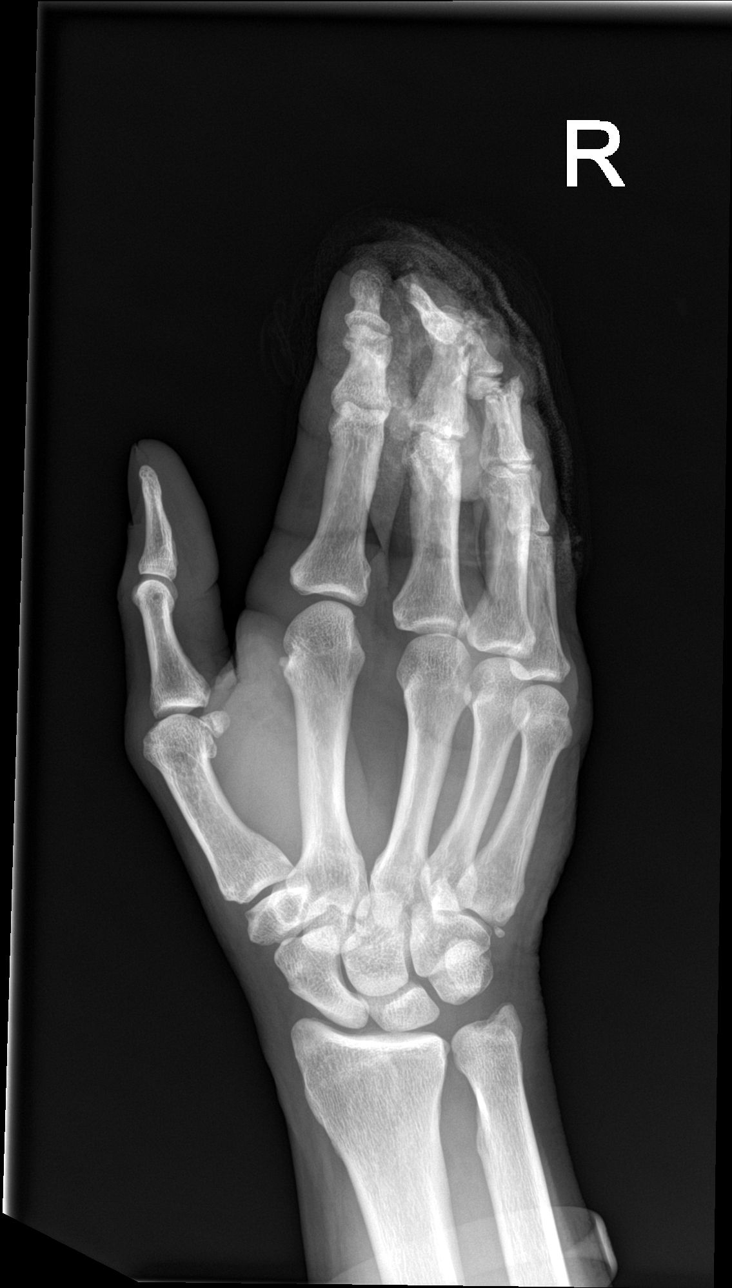

[hand pa (2 of 2)]
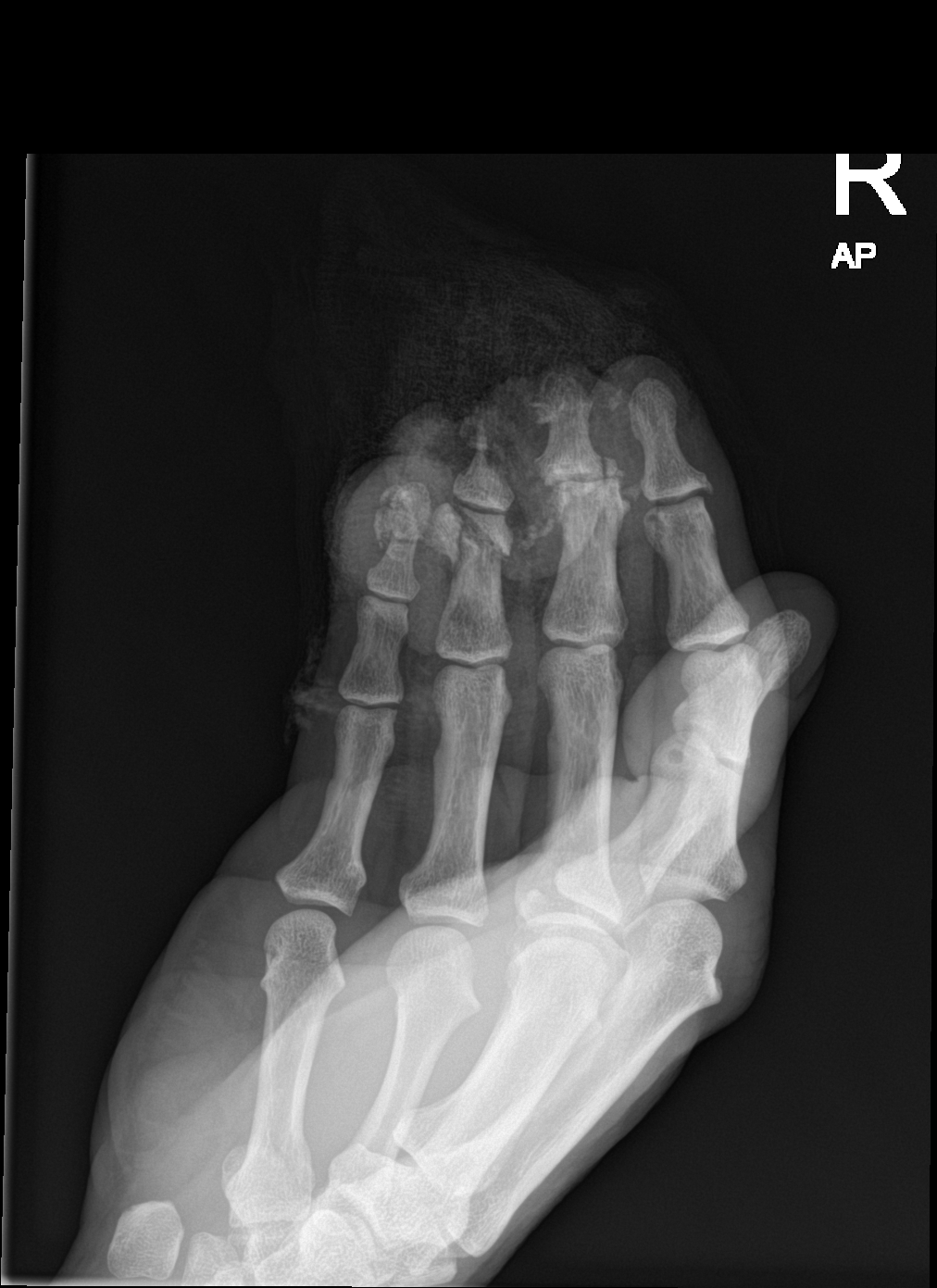

[4 of 4 positions shown; findings below may reference images not displayed]

FINDINGS: There are comminuted open fractures of the third digit distal
phalanx which extends to the distal interphalangeal joint, fourth
digit distal and middle phalanges which extend to the distal
interphalangeal joint, and the fifth digit distal phalanx which does
not extend to the joint space. There appears to be a wound of the
distal second digit, however there is no definite fracture of the
second digit. No definite radiopaque foreign bodies within the soft
tissues of the hand.
IMPRESSION: 1. Comminuted open fractures of the third, fourth, and fifth digits.

## 2022-06-07 ENCOUNTER — Other Ambulatory Visit (HOSPITAL_COMMUNITY): Payer: Self-pay

## 2022-06-07 MED ORDER — OLMESARTAN MEDOXOMIL 40 MG PO TABS
40.0000 mg | ORAL_TABLET | Freq: Every day | ORAL | 0 refills | Status: DC
  Filled 2022-06-07: qty 30, 30d supply, fill #0

## 2022-06-10 ENCOUNTER — Other Ambulatory Visit (HOSPITAL_COMMUNITY): Payer: Self-pay

## 2022-07-18 ENCOUNTER — Other Ambulatory Visit (HOSPITAL_COMMUNITY): Payer: Self-pay

## 2022-07-23 ENCOUNTER — Other Ambulatory Visit (HOSPITAL_COMMUNITY): Payer: Self-pay

## 2022-07-23 MED ORDER — OLMESARTAN MEDOXOMIL 40 MG PO TABS
40.0000 mg | ORAL_TABLET | Freq: Every day | ORAL | 0 refills | Status: DC
  Filled 2022-07-23: qty 90, 90d supply, fill #0

## 2022-10-28 ENCOUNTER — Other Ambulatory Visit (HOSPITAL_COMMUNITY): Payer: Self-pay

## 2022-10-28 MED ORDER — OLMESARTAN MEDOXOMIL 40 MG PO TABS
40.0000 mg | ORAL_TABLET | Freq: Every day | ORAL | 0 refills | Status: DC
  Filled 2022-10-28: qty 90, 90d supply, fill #0
  Filled 2022-10-28: qty 30, 30d supply, fill #0
  Filled 2022-12-02: qty 30, 30d supply, fill #1
  Filled 2022-12-30: qty 30, 30d supply, fill #2

## 2022-10-28 MED ORDER — HYDROCHLOROTHIAZIDE 12.5 MG PO TABS
25.0000 mg | ORAL_TABLET | Freq: Every day | ORAL | 0 refills | Status: DC
  Filled 2022-10-28: qty 60, 30d supply, fill #0
  Filled 2022-10-28: qty 180, 90d supply, fill #0
  Filled 2022-12-02: qty 60, 30d supply, fill #1
  Filled 2022-12-30: qty 60, 30d supply, fill #2

## 2022-12-02 ENCOUNTER — Other Ambulatory Visit (HOSPITAL_COMMUNITY): Payer: Self-pay

## 2022-12-30 ENCOUNTER — Other Ambulatory Visit (HOSPITAL_COMMUNITY): Payer: Self-pay

## 2023-01-27 ENCOUNTER — Other Ambulatory Visit (HOSPITAL_COMMUNITY): Payer: Self-pay

## 2023-01-27 ENCOUNTER — Encounter: Payer: Self-pay | Admitting: Family Medicine

## 2023-01-27 ENCOUNTER — Ambulatory Visit: Payer: 59 | Admitting: Family Medicine

## 2023-01-27 VITALS — BP 108/64 | HR 104 | Temp 99.1°F | Ht 69.0 in | Wt 187.4 lb

## 2023-01-27 DIAGNOSIS — Z114 Encounter for screening for human immunodeficiency virus [HIV]: Secondary | ICD-10-CM | POA: Diagnosis not present

## 2023-01-27 DIAGNOSIS — I10 Essential (primary) hypertension: Secondary | ICD-10-CM | POA: Diagnosis not present

## 2023-01-27 DIAGNOSIS — Z1159 Encounter for screening for other viral diseases: Secondary | ICD-10-CM | POA: Diagnosis not present

## 2023-01-27 DIAGNOSIS — Z7689 Persons encountering health services in other specified circumstances: Secondary | ICD-10-CM

## 2023-01-27 MED ORDER — OLMESARTAN MEDOXOMIL 40 MG PO TABS
40.0000 mg | ORAL_TABLET | Freq: Every day | ORAL | 0 refills | Status: DC
  Filled 2023-01-27: qty 90, 90d supply, fill #0

## 2023-01-27 MED ORDER — HYDROCHLOROTHIAZIDE 12.5 MG PO TABS
25.0000 mg | ORAL_TABLET | Freq: Every day | ORAL | 0 refills | Status: DC
  Filled 2023-01-27: qty 180, 90d supply, fill #0

## 2023-01-27 NOTE — Progress Notes (Unsigned)
New Patient Office Visit  Subjective    Patient ID: Ross Williams, Ross Williams    DOB: [birth year]  Age: 52 y.o. MRN: [id number]  CC:  Chief Complaint  Patient presents with   Establish Care    [name]. Pt is requesting medication refills.    HPI Ross Williams presents to establish care with new practice.   Patient was previously with [facility] with this provider, Ross Merino, NP.   Specialist: [facility] with Dr. Melina Copa.   HTN: Chronic. Prescribed HCTZ 12.'5mg'$ , 1 tablet in the morning and 1 tablet in the evening and Olmesartan '40mg'$  at night time. He denies any complications with getting up during the night to urinate with taking HCTZ in the evening. He monitors his blood pressure at home 98-120s/60-70s. Denies chest pain, shortness of breath, diziness, lightheadness, lower extremiety edema, or regular headaches.   Outpatient Encounter Medications as of 01/27/2023  Medication Sig   olmesartan (BENICAR) 40 MG tablet Take 1 tablet (40 mg total) by mouth daily.   ranitidine (ZANTAC) 150 MG capsule Take 150 mg by mouth once.   [DISCONTINUED] hydrochlorothiazide (HYDRODIURIL) 12.5 MG tablet Take 2 tablets (25 mg total) by mouth daily.   [DISCONTINUED] olmesartan (BENICAR) 40 MG tablet Take 1 tablet (40 mg total) by mouth daily.   hydrochlorothiazide (HYDRODIURIL) 12.5 MG tablet Take 2 tablets (25 mg total) by mouth daily.   olmesartan (BENICAR) 40 MG tablet Take 1 tablet (40 mg total) by mouth daily.   [DISCONTINUED] gabapentin (NEURONTIN) 300 MG capsule Take 1 capsule (300 mg total) by mouth 3 (three) times daily.   [DISCONTINUED] oxyCODONE (ROXICODONE) 5 MG immediate release tablet Take 1 tablet (5 mg total) by mouth every 6 (six) hours as needed for severe pain. (Patient not taking: Reported on 01/27/2023)   No facility-administered encounter medications on file as of 01/27/2023.    Past Medical History:  Diagnosis Date   GERD (gastroesophageal  reflux disease)    Hypertension     Past Surgical History:  Procedure Laterality Date   AMPUTATION Right 08/07/2020   Procedure: REVISION AMPUTATION OF LONG AND RING FINGERS;  Surgeon: Milly Jakob, MD;  Location: [place];  Service: Orthopedics;  Laterality: Right;   FINGER REPLANTATION Right 08/07/2020   Procedure: OPEN TREATMENT OF LONG FINGER DISLOCATION;  Surgeon: Milly Jakob, MD;  Location: [place];  Service: Orthopedics;  Laterality: Right;   I & D EXTREMITY Right 08/07/2020   Procedure: IRRIGATION AND DEBRIDEMENT OF RIGHT HAND FINGERS;  Surgeon: Milly Jakob, MD;  Location: [place];  Service: Orthopedics;  Laterality: Right;    History reviewed. No pertinent family history.  Social History   Socioeconomic History   Marital status: Married    Spouse name: Not on file   Number of children: Not on file   Years of education: Not on file   Highest education level: Not on file  Occupational History   Not on file  Tobacco Use   Smoking status: Former    Packs/day: 1.00    Years: 40.00    Total pack years: 40.00    Types: Cigarettes   Smokeless tobacco: Current  Vaping Use   Vaping Use: Every day  Substance and Sexual Activity   Alcohol use: Not on file   Drug use: Never   Sexual activity: Yes  Other Topics Concern   Not on file  Social History Narrative   Not on file   Social Determinants of Health   Financial  Resource Strain: Not on file  Food Insecurity: Not on file  Transportation Needs: Not on file  Physical Activity: Not on file  Stress: Not on file  Social Connections: Not on file  Intimate Partner Violence: Not on file   ROS See HPI above    Objective    BP 108/64   Pulse (!) 104   Temp 99.1 F (37.3 C)   Ht '5\' 9"'$  (1.753 m)   Wt 187 lb 7 oz (85 kg)   BMI 27.68 kg/m   Physical Exam Vitals reviewed.  Constitutional:      General: He is not in acute distress.    Appearance: Normal appearance. He is not ill-appearing or toxic-appearing.   Eyes:     General:        Right eye: No discharge.        Left eye: No discharge.     Conjunctiva/sclera: Conjunctivae normal.  Cardiovascular:     Rate and Rhythm: Normal rate and regular rhythm.     Heart sounds: Normal heart sounds. No murmur heard.    No friction rub. No gallop.  Pulmonary:     Effort: Pulmonary effort is normal. No respiratory distress.     Breath sounds: Normal breath sounds.  Musculoskeletal:        General: Normal range of motion.     Right lower leg: No edema.     Left lower leg: No edema.  Skin:    General: Skin is warm and dry.  Neurological:     General: No focal deficit present.     Mental Status: He is alert and oriented to person, place, and time. Mental status is at baseline.  Psychiatric:        Mood and Affect: Mood normal.        Behavior: Behavior normal.        Thought Content: Thought content normal.        Judgment: Judgment normal.      Assessment & Plan:  Essential hypertension Assessment & Plan: Blood pressure is stable today. Continue with HCTZ '25mg'$  BID and Olmesartan '40mg'$  daily. Refilled medication. Ordered CMP to assess kidney function.   Orders: -     hydroCHLOROthiazide; Take 2 tablets (25 mg total) by mouth daily.  Dispense: 180 tablet; Refill: 0 -     Olmesartan Medoxomil; Take 1 tablet (40 mg total) by mouth daily.  Dispense: 90 tablet; Refill: 0 -     Comprehensive metabolic panel  Establishing care with new doctor, encounter for  Encounter for screening for HIV -     HIV Antibody (routine testing w rflx)  Need for hepatitis C screening test -     Hepatitis C antibody  -Reviewed health maintenance. Ordered screening labs for HIV and Hepatitis C. Requesting records from [place] for colonoscopy results. Requesting records from [facility] for his influenza vaccine. Declines shingle vaccine, may get it at his next appointment.  -Congratulated patient on decreasing smoking. He reports he has mostly switched from  cigarettes to vaping.   Return in about 6 months (around 07/30/2023) for physical.  He is aware to be fasting for the physical appointment.  Ross Merino, NP

## 2023-01-27 NOTE — Patient Instructions (Addendum)
-  It was a pleasure to see you today. I am so happy that you are working on stopping smoking. Congratulations on the news about the grandbaby !!!! -Refilled Hydrochlorothiazide and Olmesartan.  -Will check kidney function today. Screening for HIV and hepatitis.  -Follow up in 6 months for physical. Be fasting at that appointment.

## 2023-01-28 ENCOUNTER — Telehealth: Payer: Self-pay

## 2023-01-28 LAB — COMPREHENSIVE METABOLIC PANEL
ALT: 20 U/L (ref 0–53)
AST: 19 U/L (ref 0–37)
Albumin: 4.1 g/dL (ref 3.5–5.2)
Alkaline Phosphatase: 74 U/L (ref 39–117)
BUN: 25 mg/dL — ABNORMAL HIGH (ref 6–23)
CO2: 30 mEq/L (ref 19–32)
Calcium: 9.6 mg/dL (ref 8.4–10.5)
Chloride: 104 mEq/L (ref 96–112)
Creatinine, Ser: 1.18 mg/dL (ref 0.40–1.50)
GFR: 70.21 mL/min (ref >60.00)
Glucose, Bld: 81 mg/dL (ref 70–99)
Potassium: 4.2 mEq/L (ref 3.5–5.1)
Sodium: 142 mEq/L (ref 135–145)
Total Bilirubin: 0.5 mg/dL (ref 0.2–1.2)
Total Protein: 6.9 g/dL (ref 6.0–8.3)

## 2023-01-28 LAB — HEPATITIS C ANTIBODY: Hepatitis C Ab: NONREACTIVE

## 2023-01-28 LAB — HIV ANTIBODY (ROUTINE TESTING W REFLEX): HIV 1&2 Ab, 4th Generation: NONREACTIVE

## 2023-01-28 NOTE — Telephone Encounter (Signed)
Left lab results on pt VM

## 2023-01-28 NOTE — Telephone Encounter (Signed)
-----   Message from Evangeline Gula, NP sent at 01/28/2023 12:21 PM EST ----- BUN is slightly elevated, recommend to stay hydrated. Besides that CMP is normal.

## 2023-01-28 NOTE — Telephone Encounter (Signed)
-----   Message from Evangeline Gula, NP sent at 01/28/2023  3:23 PM EST ----- HIV is non reactive. Hepatitis C non reactive.

## 2023-01-28 NOTE — Telephone Encounter (Signed)
Left Vm with results

## 2023-01-29 ENCOUNTER — Encounter: Payer: Self-pay | Admitting: Family Medicine

## 2023-01-29 NOTE — Assessment & Plan Note (Signed)
Blood pressure is stable today. Continue with HCTZ '25mg'$  BID and Olmesartan '40mg'$  daily. Refilled medication. Ordered CMP to assess kidney function.

## 2023-05-16 ENCOUNTER — Other Ambulatory Visit (HOSPITAL_COMMUNITY): Payer: Self-pay

## 2023-05-16 ENCOUNTER — Other Ambulatory Visit: Payer: Self-pay | Admitting: Family Medicine

## 2023-05-16 DIAGNOSIS — I10 Essential (primary) hypertension: Secondary | ICD-10-CM

## 2023-05-16 MED ORDER — OLMESARTAN MEDOXOMIL 40 MG PO TABS
40.0000 mg | ORAL_TABLET | Freq: Every day | ORAL | 0 refills | Status: DC
  Filled 2023-05-16: qty 90, 90d supply, fill #0

## 2023-05-16 MED ORDER — HYDROCHLOROTHIAZIDE 12.5 MG PO TABS
25.0000 mg | ORAL_TABLET | Freq: Every day | ORAL | 0 refills | Status: DC
  Filled 2023-05-16: qty 180, 90d supply, fill #0

## 2023-07-10 ENCOUNTER — Encounter (INDEPENDENT_AMBULATORY_CARE_PROVIDER_SITE_OTHER): Payer: Self-pay

## 2023-07-30 ENCOUNTER — Encounter: Payer: 59 | Admitting: Family Medicine

## 2023-08-08 ENCOUNTER — Other Ambulatory Visit: Payer: Self-pay | Admitting: Family Medicine

## 2023-08-08 ENCOUNTER — Other Ambulatory Visit (HOSPITAL_COMMUNITY): Payer: Self-pay

## 2023-08-08 DIAGNOSIS — I10 Essential (primary) hypertension: Secondary | ICD-10-CM

## 2023-08-08 MED ORDER — HYDROCHLOROTHIAZIDE 12.5 MG PO TABS
25.0000 mg | ORAL_TABLET | Freq: Every day | ORAL | 0 refills | Status: DC
  Filled 2023-08-08: qty 180, 90d supply, fill #0

## 2023-08-08 MED ORDER — OLMESARTAN MEDOXOMIL 40 MG PO TABS
40.0000 mg | ORAL_TABLET | Freq: Every day | ORAL | 0 refills | Status: DC
  Filled 2023-08-08: qty 90, 90d supply, fill #0

## 2023-09-11 DIAGNOSIS — H52223 Regular astigmatism, bilateral: Secondary | ICD-10-CM | POA: Diagnosis not present

## 2023-09-11 DIAGNOSIS — H5203 Hypermetropia, bilateral: Secondary | ICD-10-CM | POA: Diagnosis not present

## 2023-09-11 DIAGNOSIS — H524 Presbyopia: Secondary | ICD-10-CM | POA: Diagnosis not present

## 2023-12-04 ENCOUNTER — Other Ambulatory Visit (HOSPITAL_COMMUNITY): Payer: Self-pay

## 2023-12-04 ENCOUNTER — Encounter (HOSPITAL_BASED_OUTPATIENT_CLINIC_OR_DEPARTMENT_OTHER): Payer: Self-pay | Admitting: Student

## 2023-12-04 ENCOUNTER — Encounter (HOSPITAL_BASED_OUTPATIENT_CLINIC_OR_DEPARTMENT_OTHER): Payer: Self-pay

## 2023-12-04 ENCOUNTER — Ambulatory Visit (HOSPITAL_BASED_OUTPATIENT_CLINIC_OR_DEPARTMENT_OTHER): Payer: 59 | Admitting: Student

## 2023-12-04 VITALS — BP 144/74 | HR 78 | Temp 97.9°F | Ht 68.31 in | Wt 178.8 lb

## 2023-12-04 DIAGNOSIS — Z7689 Persons encountering health services in other specified circumstances: Secondary | ICD-10-CM

## 2023-12-04 DIAGNOSIS — R062 Wheezing: Secondary | ICD-10-CM

## 2023-12-04 DIAGNOSIS — Z122 Encounter for screening for malignant neoplasm of respiratory organs: Secondary | ICD-10-CM | POA: Diagnosis not present

## 2023-12-04 DIAGNOSIS — I10 Essential (primary) hypertension: Secondary | ICD-10-CM | POA: Diagnosis not present

## 2023-12-04 DIAGNOSIS — Z13220 Encounter for screening for lipoid disorders: Secondary | ICD-10-CM | POA: Diagnosis not present

## 2023-12-04 DIAGNOSIS — Z136 Encounter for screening for cardiovascular disorders: Secondary | ICD-10-CM | POA: Diagnosis not present

## 2023-12-04 DIAGNOSIS — Z131 Encounter for screening for diabetes mellitus: Secondary | ICD-10-CM | POA: Diagnosis not present

## 2023-12-04 DIAGNOSIS — F17200 Nicotine dependence, unspecified, uncomplicated: Secondary | ICD-10-CM | POA: Diagnosis not present

## 2023-12-04 DIAGNOSIS — K219 Gastro-esophageal reflux disease without esophagitis: Secondary | ICD-10-CM | POA: Diagnosis not present

## 2023-12-04 DIAGNOSIS — R197 Diarrhea, unspecified: Secondary | ICD-10-CM | POA: Diagnosis not present

## 2023-12-04 MED ORDER — OMEPRAZOLE 20 MG PO CPDR
20.0000 mg | DELAYED_RELEASE_CAPSULE | Freq: Every day | ORAL | 6 refills | Status: DC
  Filled 2023-12-04: qty 30, 30d supply, fill #0
  Filled 2024-01-27: qty 30, 30d supply, fill #1
  Filled 2024-03-02: qty 30, 30d supply, fill #2
  Filled 2024-03-23 – 2024-03-26 (×2): qty 30, 30d supply, fill #3
  Filled 2024-05-10: qty 30, 30d supply, fill #4
  Filled 2024-06-10: qty 30, 30d supply, fill #5
  Filled 2024-07-19: qty 30, 30d supply, fill #6

## 2023-12-04 MED ORDER — ALBUTEROL SULFATE HFA 108 (90 BASE) MCG/ACT IN AERS
2.0000 | INHALATION_SPRAY | Freq: Four times a day (QID) | RESPIRATORY_TRACT | 0 refills | Status: DC | PRN
  Filled 2023-12-04: qty 6.7, 17d supply, fill #0

## 2023-12-04 NOTE — Assessment & Plan Note (Signed)
 BP elevated as of this visit at 144/74. Currently compliant on HCTZ 12.5 mg twice daily and olmesartan 40 mg once daily.

## 2023-12-04 NOTE — Assessment & Plan Note (Signed)
 Order albuterol inhaler.  Noted alongside rhinorrhea. Likely order PFTs if wheeze is still auscultatory at next visit due to setting of long term smoking. Discussed precautions to return.

## 2023-12-04 NOTE — Assessment & Plan Note (Signed)
 Sent in prescription for refill of prilosec 20 mg. Stable without symptoms of reflux. Discussed possible triggers to avoid.

## 2023-12-04 NOTE — Progress Notes (Signed)
 New Patient Office Visit  Subjective    Patient ID: Ross Williams, male    DOB: 1971-11-07  Age: 53 y.o. MRN: [id number]  CC:  Chief Complaint  Patient presents with   Establish Care    Here to establish care. Was seeing someone in [place].     HPI Ross Williams presents to establish care. Prior PCP was in [place]. Last physical was unknown. he notes that he requires refills of none.   PMH of hypertension, reflux, and diarrhea s/p cholecystectomy.  Hypertension- Patient notes that BP has been running high for years. Compliant on hydrochlorothiazide  twice daily and olmesartan  40mg  once daily. Will do log to assess for further need of reduction.   Diarrhea- Taking imodium  for diarrhea post-cholecystectomy. Notes this is of need with fatty meals.  Reflux- Stable on prilosec 20mg . Elevating head of bed at night. Discussed possible triggers. Patient would like to have prescription for this as opposed to purchasing OTC.  Screenings:  Colon Cancer: was around 2022. Clean per patient. Records request. Lung Cancer: 0.5 ppd, decreasing. No nicotine  withdrawal. Discussed options. Smoking for 42 years. Patient is agreeable to low-dose Chest CT. Diabetes: Indicated.  HLD: Indicated.  Rhinnorrhea/Wheeze-  patient notes short term rhinorrhea. Auscultation revealed wheeze at left lung base, when asked patient notes that has been coughing up mucous. He states that he has been doing so for 3 years due to smoking. Discussed possible PFTs in the future due to history of smoking. Patient agreeable to albuterol  inhaler for now.  Outpatient Encounter Medications as of 12/04/2023  Medication Sig   albuterol  (VENTOLIN  HFA) 108 (90 Base) MCG/ACT inhaler Inhale 2 puffs into the lungs every 6 (six) hours as needed for wheezing or shortness of breath.   hydrochlorothiazide  (HYDRODIURIL ) 12.5 MG tablet Take 2 tablets (25 mg total) by mouth daily. (Patient taking differently: Take 12.5 mg by  mouth in the morning and at bedtime.)   ibuprofen  (ADVIL ) 200 MG tablet Take 400 mg by mouth as needed.   loperamide  (IMODIUM ) 2 MG capsule Take 2 mg by mouth in the morning and at bedtime.   olmesartan  (BENICAR ) 40 MG tablet Take 1 tablet (40 mg total) by mouth daily.   omeprazole  (PRILOSEC) 20 MG capsule Take 1 capsule (20 mg total) by mouth daily.   [DISCONTINUED] omeprazole  (PRILOSEC) 20 MG capsule Take 20 mg by mouth daily.   [DISCONTINUED] olmesartan  (BENICAR ) 40 MG tablet Take 1 tablet (40 mg total) by mouth daily.   [DISCONTINUED] ranitidine (ZANTAC) 150 MG capsule Take 150 mg by mouth once.   No facility-administered encounter medications on file as of 12/04/2023.    Past Medical History:  Diagnosis Date   GERD (gastroesophageal reflux disease)    Hypertension     Past Surgical History:  Procedure Laterality Date   AMPUTATION Right 08/07/2020   Procedure: REVISION AMPUTATION OF LONG AND RING FINGERS;  Surgeon: Sebastian Lenis, MD;  Location: [facility] OR;  Service: Orthopedics;  Laterality: Right;   FINGER REPLANTATION Right 08/07/2020   Procedure: OPEN TREATMENT OF LONG FINGER DISLOCATION;  Surgeon: Sebastian Lenis, MD;  Location: [facility] OR;  Service: Orthopedics;  Laterality: Right;   GALLBLADDER SURGERY     HERNIA REPAIR Left    I & D EXTREMITY Right 08/07/2020   Procedure: IRRIGATION AND DEBRIDEMENT OF RIGHT HAND FINGERS;  Surgeon: Sebastian Lenis, MD;  Location: [facility] OR;  Service: Orthopedics;  Laterality: Right;    Family History  Adopted: Yes    Social History  Socioeconomic History   Marital status: Married    Spouse name: Not on file   Number of children: Not on file   Years of education: Not on file   Highest education level: Not on file  Occupational History   Occupation: [employer]    Comment: Food & [employer]  Tobacco Use   Smoking status: Every Day    Current packs/day: 0.50    Types: Cigarettes    Passive exposure: Current   Smokeless tobacco:  Current  Vaping Use   Vaping status: Never Used  Substance and Sexual Activity   Alcohol use: Not Currently    Comment: 10 years ago 12/04/2023   Drug use: Never   Sexual activity: Yes  Other Topics Concern   Not on file  Social History Narrative   1 step son    Social Drivers of Corporate Investment Banker Strain: Not on file  Food Insecurity: No Food Insecurity (12/04/2023)   Hunger Vital Sign    Worried About Running Out of Food in the Last Year: Never true    Ran Out of Food in the Last Year: Never true  Transportation Needs: No Transportation Needs (12/04/2023)   PRAPARE - Administrator, Civil Service (Medical): No    Lack of Transportation (Non-Medical): No  Physical Activity: Not on file  Stress: Not on file  Social Connections: Not on file  Intimate Partner Violence: Not At Risk (12/04/2023)   Humiliation, Afraid, Rape, and Kick questionnaire    Fear of Current or Ex-Partner: No    Emotionally Abused: No    Physically Abused: No    Sexually Abused: No    ROS  Per HPI      Objective    BP (!) 144/74 (BP Location: Left Arm, Patient Position: Sitting, Cuff Size: Normal)   Pulse 78   Temp 97.9 F (36.6 C) (Oral)   Ht 5' 8.31 (1.735 m)   Wt 178 lb 12.8 oz (81.1 kg)   SpO2 96%   BMI 26.94 kg/m   Physical Exam Constitutional:      General: He is not in acute distress.    Appearance: Normal appearance. He is not ill-appearing.  HENT:     Head: Normocephalic and atraumatic.     Right Ear: External ear normal.     Left Ear: External ear normal.     Mouth/Throat:     Mouth: Mucous membranes are moist.     Pharynx: Oropharynx is clear.  Eyes:     General: No scleral icterus.    Extraocular Movements: Extraocular movements intact.     Conjunctiva/sclera: Conjunctivae normal.     Pupils: Pupils are equal, round, and reactive to light.  Neck:     Vascular: No carotid bruit.  Cardiovascular:     Rate and Rhythm: Normal rate and regular rhythm.      Pulses: Normal pulses.     Heart sounds: Normal heart sounds. No murmur heard.    No friction rub.  Pulmonary:     Effort: Pulmonary effort is normal. No respiratory distress.     Breath sounds: Wheezing (Left Lower Lung Base.) present. No rhonchi or rales.  Musculoskeletal:        General: Normal range of motion.     Cervical back: Neck supple.     Right lower leg: No edema.     Left lower leg: No edema.  Skin:    General: Skin is warm and dry.  Coloration: Skin is not jaundiced or pale.  Neurological:     Mental Status: He is alert and oriented to person, place, and time.  Psychiatric:        Mood and Affect: Mood normal.        Behavior: Behavior normal.         Assessment & Plan:   Encounter to establish care -     CBC with Differential/Platelet  Essential hypertension Assessment & Plan: BP elevated as of this visit at 144/74. Currently compliant on HCTZ 12.5 mg twice daily and olmesartan  40 mg once daily.  Orders: -     CBC with Differential/Platelet -     Comprehensive metabolic panel  Gastroesophageal reflux disease, unspecified whether esophagitis present Assessment & Plan: Sent in prescription for refill of prilosec 20 mg. Stable without symptoms of reflux. Discussed possible triggers to avoid.  Orders: -     Omeprazole ; Take 1 capsule (20 mg total) by mouth daily.  Dispense: 30 capsule; Refill: 6  Wheeze Assessment & Plan: Order albuterol  inhaler.  Noted alongside rhinorrhea. Likely order PFTs if wheeze is still auscultatory at next visit due to setting of long term smoking. Discussed precautions to return.   Orders: -     Albuterol  Sulfate HFA; Inhale 2 puffs into the lungs every 6 (six) hours as needed for wheezing or shortness of breath.  Dispense: 6.7 g; Refill: 0 -     CBC with Differential/Platelet -     Comprehensive metabolic panel  Screening for diabetes mellitus -     Hemoglobin A1c  Diarrhea, unspecified type Assessment &  Plan: Continue Immodium as needed   Smoker -     CT CHEST LUNG CANCER SCREENING LOW DOSE WO CONTRAST; Future  Screening for lung cancer -     CT CHEST LUNG CANCER SCREENING LOW DOSE WO CONTRAST; Future  Encounter for lipid screening for cardiovascular disease -     Lipid panel   I have spent greater than 45 minutes charting, educating, diagnosing and managing this patient for this visit.   Return in about 6 weeks (around 01/15/2024) for Chronic Followup.   Severiano Utsey T Gino Garrabrant, PA-C

## 2023-12-04 NOTE — Assessment & Plan Note (Signed)
 Continue Immodium as needed

## 2023-12-05 ENCOUNTER — Other Ambulatory Visit (HOSPITAL_BASED_OUTPATIENT_CLINIC_OR_DEPARTMENT_OTHER): Payer: Self-pay | Admitting: Student

## 2023-12-05 ENCOUNTER — Other Ambulatory Visit (HOSPITAL_COMMUNITY): Payer: Self-pay

## 2023-12-05 DIAGNOSIS — E781 Pure hyperglyceridemia: Secondary | ICD-10-CM

## 2023-12-05 LAB — COMPREHENSIVE METABOLIC PANEL
ALT: 26 [IU]/L (ref 0–44)
AST: 21 [IU]/L (ref 0–40)
Albumin: 4.3 g/dL (ref 3.8–4.9)
Alkaline Phosphatase: 86 [IU]/L (ref 44–121)
BUN/Creatinine Ratio: 18 (ref 9–20)
BUN: 21 mg/dL (ref 6–24)
Bilirubin Total: 0.2 mg/dL (ref 0.0–1.2)
CO2: 24 mmol/L (ref 20–29)
Calcium: 9.4 mg/dL (ref 8.7–10.2)
Chloride: 101 mmol/L (ref 96–106)
Creatinine, Ser: 1.15 mg/dL (ref 0.76–1.27)
Globulin, Total: 2.3 g/dL (ref 1.5–4.5)
Glucose: 114 mg/dL — ABNORMAL HIGH (ref 70–99)
Potassium: 4.9 mmol/L (ref 3.5–5.2)
Sodium: 140 mmol/L (ref 134–144)
Total Protein: 6.6 g/dL (ref 6.0–8.5)
eGFR: 76 mL/min/{1.73_m2} (ref >59)

## 2023-12-05 LAB — HEMOGLOBIN A1C
Est. average glucose Bld gHb Est-mCnc: 100 mg/dL
Hgb A1c MFr Bld: 5.1 % (ref 4.8–5.6)

## 2023-12-05 LAB — CBC WITH DIFFERENTIAL/PLATELET
Basophils Absolute: 0.1 10*3/uL (ref 0.0–0.2)
Basos: 1 %
EOS (ABSOLUTE): 0.2 10*3/uL (ref 0.0–0.4)
Eos: 2 %
Hematocrit: 46 % (ref 37.5–51.0)
Hemoglobin: 16.4 g/dL (ref 13.0–17.7)
Immature Grans (Abs): 0 10*3/uL (ref 0.0–0.1)
Immature Granulocytes: 0 %
Lymphocytes Absolute: 2.2 10*3/uL (ref 0.7–3.1)
Lymphs: 28 %
MCH: 34.7 pg — ABNORMAL HIGH (ref 26.6–33.0)
MCHC: 35.7 g/dL (ref 31.5–35.7)
MCV: 97 fL (ref 79–97)
Monocytes Absolute: 0.8 10*3/uL (ref 0.1–0.9)
Monocytes: 10 %
Neutrophils Absolute: 4.8 10*3/uL (ref 1.4–7.0)
Neutrophils: 59 %
Platelets: 343 10*3/uL (ref 150–450)
RBC: 4.73 x10E6/uL (ref 4.14–5.80)
RDW: 12.8 % (ref 11.6–15.4)
WBC: 8 10*3/uL (ref 3.4–10.8)

## 2023-12-05 LAB — LIPID PANEL
Chol/HDL Ratio: 6 {ratio} — ABNORMAL HIGH (ref 0.0–5.0)
Cholesterol, Total: 193 mg/dL (ref 100–199)
HDL: 32 mg/dL — ABNORMAL LOW (ref >39)
LDL Chol Calc (NIH): 109 mg/dL — ABNORMAL HIGH (ref 0–99)
Triglycerides: 304 mg/dL — ABNORMAL HIGH (ref 0–149)
VLDL Cholesterol Cal: 52 mg/dL — ABNORMAL HIGH (ref 5–40)

## 2023-12-05 MED ORDER — OMEGA-3-ACID ETHYL ESTERS 1 G PO CAPS
1.0000 g | ORAL_CAPSULE | Freq: Two times a day (BID) | ORAL | 3 refills | Status: DC
  Filled 2023-12-05: qty 60, 30d supply, fill #0
  Filled 2024-01-13 (×2): qty 60, 30d supply, fill #1
  Filled 2024-02-10: qty 60, 30d supply, fill #2
  Filled 2024-03-22: qty 60, 30d supply, fill #3

## 2023-12-25 ENCOUNTER — Other Ambulatory Visit: Payer: Self-pay | Admitting: Student

## 2023-12-25 ENCOUNTER — Other Ambulatory Visit (HOSPITAL_COMMUNITY): Payer: Self-pay

## 2023-12-25 DIAGNOSIS — I10 Essential (primary) hypertension: Secondary | ICD-10-CM

## 2023-12-26 ENCOUNTER — Other Ambulatory Visit (HOSPITAL_BASED_OUTPATIENT_CLINIC_OR_DEPARTMENT_OTHER): Payer: Self-pay | Admitting: Student

## 2023-12-26 ENCOUNTER — Encounter (HOSPITAL_BASED_OUTPATIENT_CLINIC_OR_DEPARTMENT_OTHER): Payer: Self-pay | Admitting: Student

## 2023-12-26 ENCOUNTER — Other Ambulatory Visit (HOSPITAL_COMMUNITY): Payer: Self-pay

## 2023-12-26 DIAGNOSIS — I10 Essential (primary) hypertension: Secondary | ICD-10-CM

## 2023-12-26 MED ORDER — CHLORTHALIDONE 25 MG PO TABS
25.0000 mg | ORAL_TABLET | Freq: Every day | ORAL | 2 refills | Status: DC
  Filled 2023-12-26: qty 90, 90d supply, fill #0
  Filled 2024-03-22: qty 90, 90d supply, fill #1
  Filled 2024-06-29: qty 90, 90d supply, fill #2

## 2023-12-26 NOTE — Telephone Encounter (Signed)
Sent in alternate med

## 2023-12-29 ENCOUNTER — Other Ambulatory Visit (HOSPITAL_COMMUNITY): Payer: Self-pay

## 2023-12-29 ENCOUNTER — Encounter (HOSPITAL_COMMUNITY): Payer: Self-pay

## 2024-01-01 ENCOUNTER — Ambulatory Visit (HOSPITAL_BASED_OUTPATIENT_CLINIC_OR_DEPARTMENT_OTHER): Payer: 59 | Admitting: Radiology

## 2024-01-02 ENCOUNTER — Other Ambulatory Visit (HOSPITAL_COMMUNITY): Payer: Self-pay

## 2024-01-05 ENCOUNTER — Ambulatory Visit (HOSPITAL_BASED_OUTPATIENT_CLINIC_OR_DEPARTMENT_OTHER): Payer: 59 | Admitting: Student

## 2024-01-05 ENCOUNTER — Encounter (HOSPITAL_BASED_OUTPATIENT_CLINIC_OR_DEPARTMENT_OTHER): Payer: Self-pay | Admitting: Student

## 2024-01-05 ENCOUNTER — Other Ambulatory Visit (HOSPITAL_COMMUNITY): Payer: Self-pay

## 2024-01-05 ENCOUNTER — Ambulatory Visit (INDEPENDENT_AMBULATORY_CARE_PROVIDER_SITE_OTHER)
Admission: RE | Admit: 2024-01-05 | Discharge: 2024-01-05 | Disposition: A | Discharge status: Home / Self Care | Payer: 59 | Source: Ambulatory Visit | Attending: Student | Admitting: Student

## 2024-01-05 ENCOUNTER — Other Ambulatory Visit: Payer: Self-pay

## 2024-01-05 VITALS — BP 122/73 | HR 60 | Temp 98.0°F | Ht 68.0 in | Wt 179.5 lb

## 2024-01-05 DIAGNOSIS — Z122 Encounter for screening for malignant neoplasm of respiratory organs: Secondary | ICD-10-CM | POA: Diagnosis not present

## 2024-01-05 DIAGNOSIS — K219 Gastro-esophageal reflux disease without esophagitis: Secondary | ICD-10-CM | POA: Diagnosis not present

## 2024-01-05 DIAGNOSIS — I10 Essential (primary) hypertension: Secondary | ICD-10-CM | POA: Diagnosis not present

## 2024-01-05 DIAGNOSIS — R197 Diarrhea, unspecified: Secondary | ICD-10-CM

## 2024-01-05 DIAGNOSIS — F17200 Nicotine dependence, unspecified, uncomplicated: Secondary | ICD-10-CM

## 2024-01-05 DIAGNOSIS — F17210 Nicotine dependence, cigarettes, uncomplicated: Secondary | ICD-10-CM

## 2024-01-05 DIAGNOSIS — F17209 Nicotine dependence, unspecified, with unspecified nicotine-induced disorders: Secondary | ICD-10-CM | POA: Diagnosis not present

## 2024-01-05 DIAGNOSIS — Z716 Tobacco abuse counseling: Secondary | ICD-10-CM

## 2024-01-05 MED ORDER — NICOTINE 14 MG/24HR TD PT24
14.0000 mg | MEDICATED_PATCH | TRANSDERMAL | 5 refills | Status: DC
  Filled 2024-01-05: qty 28, 28d supply, fill #0

## 2024-01-05 MED ORDER — LOPERAMIDE HCL 2 MG PO CAPS
2.0000 mg | ORAL_CAPSULE | Freq: Two times a day (BID) | ORAL | 5 refills | Status: DC
  Filled 2024-01-05: qty 30, 15d supply, fill #0

## 2024-01-05 NOTE — Patient Instructions (Addendum)
 It was nice to see you today!  Please let me know if you have any issues with your patches. Please do not smoke on them. Please remember to rotate your sites and look out for any irritation to your skin.    For your reflux- you may try the following. Anti-reflux measures such as raising the head of the bed, avoiding tight clothing or belts, avoiding eating late at night and not lying down shortly after mealtime and achieving weight loss. Avoid Aspirin, NSAID's such as ibuprofen  and aleve), caffeine, peppermints, alcohol, and tobacco.   If you have any problems before your next visit feel free to message me via MyChart (minor issues or questions) or call the office, otherwise you may reach out to schedule an office visit.  Thank you! Adelei Scobey, PA-C

## 2024-01-05 NOTE — Progress Notes (Signed)
 Established Patient Office Visit  Subjective   Patient ID: Ross Williams, male    DOB: January 04, 1971  Age: 53 y.o. MRN: [id number]  Chief Complaint  Patient presents with   Nicotine  Dependence    Wanted to discuss smoking cessation. BP ranging 120's/60's at home.    Medication Refill    Need refill on imodium .     HPI  Tobacco Use Cessation Counseling-discussed risk risks of tobacco use. Discussed benefits of quitting. Patient has a desire to quit today.  Patient's wife recently began Wellbutrin as a means of stopping tobacco use and he notes that he desires to quit as well.  Patient would like to try patches at this time. Has tried lozenges in the past. Discussed side effects. Currently smoking 1 ppd or slightly more.  Hypertension- Pt denies chest pain, SOB, dizziness, or heart palpitations.  Taking meds as directed w/o problems.  Denies medication side effects.  BP has been in the 120s/60s at home. Olmesartan  40mg  and chlorthalidone  25mg .   GERD- Stable. Patient notes that they are utilizing lifestyle interventions to aid in treatment-plan to provide handout today.  Currently taking prilosec for reflux symptoms.  Chronic Diarrhea- Stable. Notes that he has been getting diarrhea since 2021 when he got his gallbladder. Worse with fatty foods. Refill for loperamide  sent in.   ROS Negative unless indicated in HPI.    Objective:     BP 122/73 (BP Location: Right Arm, Patient Position: Sitting, Cuff Size: Normal)   Pulse 60   Temp 98 F (36.7 C) (Oral)   Ht 5\' 8"  (1.727 m)   Wt 179 lb 8 oz (81.4 kg)   SpO2 97%   BMI 27.29 kg/m     Physical Exam Constitutional:      General: He is not in acute distress.    Appearance: Normal appearance. He is not ill-appearing.  HENT:     Head: Normocephalic and atraumatic.     Nose: Nose normal.  Eyes:     General: No scleral icterus.    Pupils: Pupils are equal, round, and reactive to light.  Cardiovascular:     Rate and Rhythm:  Normal rate and regular rhythm.     Pulses: Normal pulses.     Heart sounds: Normal heart sounds. No murmur heard.    No friction rub.  Pulmonary:     Effort: Pulmonary effort is normal. No respiratory distress.     Breath sounds: Normal breath sounds. No wheezing, rhonchi or rales.     Comments: No clubbing noted. Musculoskeletal:        General: Normal range of motion.     Cervical back: Normal range of motion.  Skin:    General: Skin is warm and dry.     Comments: R index finger yellowed likely due to tobacco use.  Neurological:     General: No focal deficit present.     Mental Status: He is alert.  Psychiatric:        Mood and Affect: Mood normal.        Behavior: Behavior normal.      No results found for any visits on 01/05/24.     The 10-year ASCVD risk score (Arnett DK, et al., 2019) is: 14.9%    Assessment & Plan:   Encounter for tobacco use cessation counseling  Tobacco use disorder, continuous Assessment & Plan: Discussed risks of tobacco use and benefits of quitting. Patient is currently smoking 1 pack/day or slightly more. He has tried  lozenges in the past. Ordered 14 mg nicotine  patches, discussed use and side effects.  Patient is aware not to smoke while on these patches.  Orders: -     Nicotine ; Place 1 patch (14 mg total) onto the skin daily.  Dispense: 28 patch; Refill: 5  Essential hypertension Assessment & Plan: Stable- continue current regimen of chlorthalidone  and olmesartan .  BP has improved and is generally in the 120s over 60s at home.   Gastroesophageal reflux disease without esophagitis Assessment & Plan: Stable-continue current regimen. Provided with reflux advised.   Diarrhea, unspecified type Assessment & Plan: This is likely due to or worsened by fatty foods status post cholecystectomy in 2021. Refill for Imodium  sent in.  Orders: -     Loperamide  HCl; Take 1 capsule (2 mg total) by mouth in the morning and at bedtime.   Dispense: 30 capsule; Refill: 5     Return in about 4 weeks (around 02/02/2024) for tobacco cessation.    Kendrew Paci T Shaquila Sigman, PA-C

## 2024-01-05 NOTE — Assessment & Plan Note (Signed)
 Discussed risks of tobacco use and benefits of quitting. Patient is currently smoking 1 pack/day or slightly more. He has tried lozenges in the past. Ordered 14 mg nicotine  patches, discussed use and side effects.  Patient is aware not to smoke while on these patches.

## 2024-01-05 NOTE — Assessment & Plan Note (Signed)
 This is likely due to or worsened by fatty foods status post cholecystectomy in 2021. Refill for Imodium  sent in.

## 2024-01-05 NOTE — Assessment & Plan Note (Signed)
 Stable-continue current regimen. Provided with reflux advised.

## 2024-01-05 NOTE — Assessment & Plan Note (Signed)
 Stable- continue current regimen of chlorthalidone  and olmesartan .  BP has improved and is generally in the 120s over 60s at home.

## 2024-01-13 ENCOUNTER — Other Ambulatory Visit (HOSPITAL_COMMUNITY): Payer: Self-pay

## 2024-01-20 ENCOUNTER — Other Ambulatory Visit (HOSPITAL_BASED_OUTPATIENT_CLINIC_OR_DEPARTMENT_OTHER): Payer: Self-pay | Admitting: Student

## 2024-01-20 ENCOUNTER — Encounter (HOSPITAL_COMMUNITY): Payer: Self-pay

## 2024-01-20 ENCOUNTER — Other Ambulatory Visit (HOSPITAL_COMMUNITY): Payer: Self-pay

## 2024-01-20 DIAGNOSIS — R197 Diarrhea, unspecified: Secondary | ICD-10-CM

## 2024-01-20 MED ORDER — LOPERAMIDE HCL 2 MG PO CAPS
2.0000 mg | ORAL_CAPSULE | Freq: Two times a day (BID) | ORAL | 5 refills | Status: AC | PRN
  Filled 2024-01-20: qty 60, 30d supply, fill #0

## 2024-01-20 NOTE — Progress Notes (Signed)
 Telephone call: Discussed findings of recent low-dose chest CT including benign appearing bilateral nodules with the largest being about 4 mm, aortic atherosclerosis, and mild centrilobular emphysema.  Patient notes that he is asymptomatic at this point.  He also states that he was only given a 15-day supply of loperamide, will plan to fix this today.

## 2024-01-21 ENCOUNTER — Other Ambulatory Visit (HOSPITAL_COMMUNITY): Payer: Self-pay

## 2024-01-27 ENCOUNTER — Other Ambulatory Visit (HOSPITAL_COMMUNITY): Payer: Self-pay

## 2024-02-02 ENCOUNTER — Ambulatory Visit (HOSPITAL_BASED_OUTPATIENT_CLINIC_OR_DEPARTMENT_OTHER): Payer: 59 | Admitting: Student

## 2024-02-10 ENCOUNTER — Other Ambulatory Visit (HOSPITAL_COMMUNITY): Payer: Self-pay

## 2024-02-10 ENCOUNTER — Other Ambulatory Visit: Payer: Self-pay | Admitting: Family Medicine

## 2024-02-10 DIAGNOSIS — I10 Essential (primary) hypertension: Secondary | ICD-10-CM

## 2024-02-10 MED ORDER — OLMESARTAN MEDOXOMIL 40 MG PO TABS
40.0000 mg | ORAL_TABLET | Freq: Every day | ORAL | 3 refills | Status: AC
  Filled 2024-02-10: qty 90, 90d supply, fill #0
  Filled 2024-07-06: qty 90, 90d supply, fill #1

## 2024-02-13 ENCOUNTER — Other Ambulatory Visit (HOSPITAL_COMMUNITY): Payer: Self-pay

## 2024-03-02 ENCOUNTER — Other Ambulatory Visit (HOSPITAL_COMMUNITY): Payer: Self-pay

## 2024-03-22 ENCOUNTER — Other Ambulatory Visit (HOSPITAL_COMMUNITY): Payer: Self-pay

## 2024-03-23 ENCOUNTER — Other Ambulatory Visit (HOSPITAL_COMMUNITY): Payer: Self-pay

## 2024-04-29 ENCOUNTER — Other Ambulatory Visit (HOSPITAL_COMMUNITY): Payer: Self-pay

## 2024-04-29 ENCOUNTER — Other Ambulatory Visit (HOSPITAL_BASED_OUTPATIENT_CLINIC_OR_DEPARTMENT_OTHER): Payer: Self-pay | Admitting: Student

## 2024-04-29 DIAGNOSIS — E781 Pure hyperglyceridemia: Secondary | ICD-10-CM

## 2024-04-30 ENCOUNTER — Other Ambulatory Visit (HOSPITAL_COMMUNITY): Payer: Self-pay

## 2024-04-30 MED ORDER — OMEGA-3-ACID ETHYL ESTERS 1 G PO CAPS
1.0000 g | ORAL_CAPSULE | Freq: Two times a day (BID) | ORAL | 3 refills | Status: DC
  Filled 2024-04-30: qty 60, 30d supply, fill #0
  Filled 2024-06-10: qty 60, 30d supply, fill #1
  Filled 2024-07-27: qty 60, 30d supply, fill #2
  Filled 2024-09-07: qty 60, 30d supply, fill #3

## 2024-06-10 ENCOUNTER — Other Ambulatory Visit (HOSPITAL_COMMUNITY): Payer: Self-pay

## 2024-06-10 ENCOUNTER — Other Ambulatory Visit: Payer: Self-pay

## 2024-06-11 ENCOUNTER — Other Ambulatory Visit (HOSPITAL_COMMUNITY): Payer: Self-pay

## 2024-07-19 ENCOUNTER — Other Ambulatory Visit (HOSPITAL_COMMUNITY): Payer: Self-pay

## 2024-07-27 ENCOUNTER — Other Ambulatory Visit (HOSPITAL_COMMUNITY): Payer: Self-pay

## 2024-09-07 ENCOUNTER — Other Ambulatory Visit (HOSPITAL_COMMUNITY): Payer: Self-pay

## 2024-09-13 DIAGNOSIS — H524 Presbyopia: Secondary | ICD-10-CM | POA: Diagnosis not present

## 2024-09-13 DIAGNOSIS — H5203 Hypermetropia, bilateral: Secondary | ICD-10-CM | POA: Diagnosis not present

## 2024-09-13 DIAGNOSIS — H52223 Regular astigmatism, bilateral: Secondary | ICD-10-CM | POA: Diagnosis not present

## 2024-10-05 ENCOUNTER — Other Ambulatory Visit (HOSPITAL_COMMUNITY): Payer: Self-pay

## 2024-10-05 ENCOUNTER — Other Ambulatory Visit (HOSPITAL_BASED_OUTPATIENT_CLINIC_OR_DEPARTMENT_OTHER): Payer: Self-pay | Admitting: Student

## 2024-10-05 DIAGNOSIS — I10 Essential (primary) hypertension: Secondary | ICD-10-CM

## 2024-10-05 MED ORDER — CHLORTHALIDONE 25 MG PO TABS
25.0000 mg | ORAL_TABLET | Freq: Every day | ORAL | 0 refills | Status: DC
  Filled 2024-10-05: qty 60, 60d supply, fill #0

## 2024-10-06 ENCOUNTER — Encounter (HOSPITAL_BASED_OUTPATIENT_CLINIC_OR_DEPARTMENT_OTHER): Payer: Self-pay

## 2024-11-01 ENCOUNTER — Ambulatory Visit (HOSPITAL_BASED_OUTPATIENT_CLINIC_OR_DEPARTMENT_OTHER): Admitting: Student

## 2024-11-01 ENCOUNTER — Other Ambulatory Visit (HOSPITAL_COMMUNITY): Payer: Self-pay

## 2024-11-01 ENCOUNTER — Encounter (HOSPITAL_BASED_OUTPATIENT_CLINIC_OR_DEPARTMENT_OTHER): Payer: Self-pay | Admitting: Student

## 2024-11-01 ENCOUNTER — Telehealth (HOSPITAL_COMMUNITY): Payer: Self-pay | Admitting: Pharmacist

## 2024-11-01 VITALS — BP 102/65 | HR 92 | Temp 98.3°F | Resp 16 | Ht 68.0 in | Wt 172.3 lb

## 2024-11-01 DIAGNOSIS — I10 Essential (primary) hypertension: Secondary | ICD-10-CM | POA: Diagnosis not present

## 2024-11-01 DIAGNOSIS — Z1211 Encounter for screening for malignant neoplasm of colon: Secondary | ICD-10-CM

## 2024-11-01 DIAGNOSIS — F17200 Nicotine dependence, unspecified, uncomplicated: Secondary | ICD-10-CM | POA: Diagnosis not present

## 2024-11-01 DIAGNOSIS — Z23 Encounter for immunization: Secondary | ICD-10-CM | POA: Diagnosis not present

## 2024-11-01 DIAGNOSIS — K219 Gastro-esophageal reflux disease without esophagitis: Secondary | ICD-10-CM | POA: Diagnosis not present

## 2024-11-01 DIAGNOSIS — E781 Pure hyperglyceridemia: Secondary | ICD-10-CM | POA: Insufficient documentation

## 2024-11-01 DIAGNOSIS — R202 Paresthesia of skin: Secondary | ICD-10-CM | POA: Diagnosis not present

## 2024-11-01 MED ORDER — OMEPRAZOLE 20 MG PO CPDR
20.0000 mg | DELAYED_RELEASE_CAPSULE | Freq: Every day | ORAL | 3 refills | Status: AC
  Filled 2024-11-01: qty 90, 90d supply, fill #0

## 2024-11-01 MED ORDER — OMEGA-3-ACID ETHYL ESTERS 1 G PO CAPS
1.0000 g | ORAL_CAPSULE | Freq: Two times a day (BID) | ORAL | 3 refills | Status: AC
  Filled 2024-11-01: qty 180, 90d supply, fill #0

## 2024-11-01 MED ORDER — CHLORTHALIDONE 25 MG PO TABS
25.0000 mg | ORAL_TABLET | Freq: Every day | ORAL | 3 refills | Status: AC
  Filled 2024-11-01 – 2024-11-29 (×2): qty 90, 90d supply, fill #0

## 2024-11-01 NOTE — Patient Instructions (Addendum)
 It was nice to see you today!  Let me know if you do not hear from GI in the next couple of weeks.  If you have any problems before your next visit feel free to message me via MyChart (minor issues or questions) or call the office, otherwise you may reach out to schedule an office visit.  Thank you! Isai Gottlieb, PA-C

## 2024-11-01 NOTE — Progress Notes (Signed)
 Established Patient Office Visit  Subjective   Patient ID: Ross Williams, male    DOB: April 28, 1971  Age: 53 y.o. MRN: [id number]  Chief Complaint  Patient presents with   Medical Management of Chronic Issues    Follow up.   Tingling    Has had some tinging on right side. Feels like it started with right hand. Happens a few times a week.     HPI  Discussed the use of AI scribe software for clinical note transcription with the patient, who gave verbal consent to proceed.  History of Present Illness   Ross Williams is a 53 year old male who presents for a follow-up visit to discuss hypertension management.  He takes chlorthalidone  and olmesartan  for blood pressure, but he experiences lightheadedness if both medications are taken in the morning. He has adjusted by taking one in the morning and one at night, if he remembers to take the evening dose. He has unintentionally lost about seven pounds.  He manages gastroesophageal reflux disease with omeprazole . He has a history of smoking and was previously given an albuterol  inhaler. He has not used the inhaler recently. He walks approximately ten miles a day for work and does not experience shortness of breath.  He has a history of polyps. His diarrhea has improved significantly.  He reports intermittent tingling in his right arm and chest area, which started about three to four months ago. The tingling lasts about three minutes and occurs a couple of times a week. 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  He is reducing his smoking habit, currently down to six cigarettes a day.      Patient Active Problem List   Diagnosis Date Noted   Tobacco use disorder, continuous 01/05/2024   Gastroesophageal reflux disease 12/04/2023   Wheeze 12/04/2023   Diarrhea 12/04/2023   Left inguinal hernia 08/31/2019   Left inguinal pain 08/31/2019    Healthcare maintenance 08/31/2019   Essential hypertension 01/22/2019   Screening for malignant neoplasm of prostate 01/22/2019   Past Medical History:  Diagnosis Date   GERD (gastroesophageal reflux disease)    Hypertension    Social History   Tobacco Use   Smoking status: Every Day    Current packs/day: 0.25    Average packs/day: 0.5 packs/day for 43.9 years (22.0 ttl pk-yrs)    Types: Cigarettes    Start date: 1982    Passive exposure: Current   Smokeless tobacco: Never  Vaping Use   Vaping status: Never Used  Substance Use Topics   Alcohol use: Not Currently    Comment: 10 years ago 12/04/2023   Drug use: Never   Allergies  Allergen Reactions   Cranberry       ROS Per HPI.    Objective:     BP 102/65   Pulse 92   Temp 98.3 F (36.8 C) (Oral)   Resp 16   Ht 5' 8 (1.727 m)   Wt 172 lb 4.8 oz (78.2 kg)   SpO2 97%   BMI 26.20 kg/m  BP Readings from Last 3 Encounters:  11/01/24 102/65  01/05/24 122/73  12/04/23 (!) 144/74   Wt Readings from Last 3 Encounters:  11/01/24 172 lb 4.8 oz (78.2 kg)  01/05/24 179 lb 8 oz (81.4 kg)  12/04/23 178 lb 12.8 oz (81.1 kg)   SpO2 Readings from Last 3 Encounters:  11/01/24 97%  01/05/24  97%  12/04/23 96%      Physical Exam Constitutional:      General: He is not in acute distress.    Appearance: Normal appearance. He is not ill-appearing.  HENT:     Head: Normocephalic and atraumatic.     Right Ear: External ear normal.     Left Ear: External ear normal.     Nose: Nose normal.  Eyes:     Conjunctiva/sclera: Conjunctivae normal.  Cardiovascular:     Rate and Rhythm: Normal rate and regular rhythm.     Pulses: Normal pulses.     Heart sounds: Normal heart sounds. No murmur heard.    No friction rub.  Pulmonary:     Effort: Pulmonary effort is normal. No respiratory distress.     Breath sounds: Normal breath sounds. No wheezing, rhonchi or rales.     Comments: Had some trace wheezes that went away  during exam Skin:    General: Skin is warm and dry.     Coloration: Skin is not jaundiced or pale.  Neurological:     Mental Status: He is alert.     Gait: Gait normal.  Psychiatric:        Mood and Affect: Mood normal.        Behavior: Behavior normal.      Results for orders placed or performed in visit on 11/01/24  HM COLONOSCOPY  Result Value Ref Range   HM Colonoscopy See Report (in chart) See Report (in chart), Patient Reported    Last CBC Lab Results  Component Value Date   WBC 8.0 12/04/2023   HGB 16.4 12/04/2023   HCT 46.0 12/04/2023   MCV 97 12/04/2023   MCH 34.7 (H) 12/04/2023   RDW 12.8 12/04/2023   PLT 343 12/04/2023   Last metabolic panel Lab Results  Component Value Date   GLUCOSE 114 (H) 12/04/2023   NA 140 12/04/2023   K 4.9 12/04/2023   CL 101 12/04/2023   CO2 24 12/04/2023   BUN 21 12/04/2023   CREATININE 1.15 12/04/2023   EGFR 76 12/04/2023   CALCIUM 9.4 12/04/2023   PROT 6.6 12/04/2023   ALBUMIN 4.3 12/04/2023   LABGLOB 2.3 12/04/2023   BILITOT 0.2 12/04/2023   ALKPHOS 86 12/04/2023   AST 21 12/04/2023   ALT 26 12/04/2023   ANIONGAP 10 08/07/2020   Last lipids Lab Results  Component Value Date   CHOL 193 12/04/2023   HDL 32 (L) 12/04/2023   LDLCALC 109 (H) 12/04/2023   TRIG 304 (H) 12/04/2023   CHOLHDL 6.0 (H) 12/04/2023   Last hemoglobin A1c Lab Results  Component Value Date   HGBA1C 5.1 12/04/2023      The 10-year ASCVD risk score (Arnett DK, et al., 2019) is: 11.5%    Assessment & Plan:    Assessment and Plan    Essential hypertension Chronic, stable.  Blood pressure is well-controlled on chlorthalidone  and olmesartan . Occasional lightheadedness when both medications are taken in the morning, resolved by splitting doses between morning and night. Recent weight loss of seven pounds, beneficial for blood pressure management. - Continue chlorthalidone  and olmesartan  with split dosing schedule.  Refill  chlorthalidone . - Monitor blood pressure regularly to ensure it does not drop too low. - Will adjust medication dosage if blood pressure drops into the 90s.  Gastroesophageal reflux disease GERD symptoms are well-managed with omeprazole . No new symptoms reported. - Continue omeprazole  as prescribed.  Refill omeprazole .  Colorectal cancer screening in patient with  history of colonic polyps Due for colonoscopy due to history of polyps. He is hesitant due to logistical challenges and personal preferences. Discussed increased risk of colorectal cancer without screening and importance of early detection. He plans to discuss with family before deciding. - Referred to GI for colonoscopy scheduling. - Discuss with family and decide on timing and logistics for colonoscopy.  Intermittent right upper extremity neuropathy Intermittent tingling in right arm and right side of chest and abdomen, possibly related to past nerve damage from hand injury. Symptoms are brief and infrequent stating that they last no more than a few minutes. No associated back/neck or neuropathic pain or other neurological symptoms. I do not have a great answer for this but did suggest that he could meet with neuro if desired-it is possible that he does have a couple pinched spinal nerves..  Tobacco use disorder Currently smoking six cigarettes per day, with a plan to reduce to five and eventually quit. Smoking cessation is ongoing and progressing well. - Continue smoking cessation efforts with gradual reduction in cigarette use.  Hypertriglyceridemia Chronic, stable.  Continue current regimen. - Continue Lovaza -refill today.  General health maintenance Shingles vaccine is due. Discussed potential side effects including soreness and redness at the injection site. He is willing to receive the first dose today. - Administered first dose of shingles vaccine today. - Scheduled nurse visit in two months for second dose of shingles  vaccine.       Return in about 3 months (around 02/08/2025) for Annual Physical.    Quincie Haroon T Derry Kassel, PA-C

## 2024-11-02 ENCOUNTER — Telehealth (HOSPITAL_COMMUNITY): Payer: Self-pay

## 2024-11-02 ENCOUNTER — Other Ambulatory Visit (HOSPITAL_COMMUNITY): Payer: Self-pay

## 2024-11-02 NOTE — Telephone Encounter (Signed)
 PA request has been Received. New Encounter has been or will be created for follow up. For additional info see Pharmacy Prior Auth telephone encounter from 11/02/24.

## 2024-11-02 NOTE — Telephone Encounter (Signed)
 Pharmacy Patient Advocate Encounter   Received notification from Pt Calls Messages that prior authorization for Omega-3-acid  Ethyl Esters 1GM capsules  is required/requested.   Insurance verification completed.   The patient is insured through [facility].   Per test claim: PA required; PA submitted to above mentioned insurance via Latent Key/confirmation #/EOC [id number] Status is pending

## 2024-11-04 NOTE — Telephone Encounter (Signed)
 Pharmacy Patient Advocate Encounter  Received notification from [facility] that Prior Authorization for  Omega-3-acid  Ethyl Esters 1GM capsules  has been DENIED.  See denial reason below. No denial letter attached in CMM. Will attach denial letter to Media tab once received.   PA #/Case ID/Reference #: [id number]

## 2024-11-19 ENCOUNTER — Telehealth (HOSPITAL_BASED_OUTPATIENT_CLINIC_OR_DEPARTMENT_OTHER): Payer: Self-pay

## 2024-11-19 NOTE — Telephone Encounter (Signed)
 Pt informed lovaza  is not covered by insurance anymore. He has not tried fenofibrate but will try whatever he needs he said.

## 2024-11-22 ENCOUNTER — Other Ambulatory Visit (HOSPITAL_BASED_OUTPATIENT_CLINIC_OR_DEPARTMENT_OTHER): Payer: Self-pay | Admitting: Student

## 2024-11-22 ENCOUNTER — Other Ambulatory Visit (HOSPITAL_COMMUNITY): Payer: Self-pay

## 2024-11-22 DIAGNOSIS — E781 Pure hyperglyceridemia: Secondary | ICD-10-CM

## 2024-11-22 MED ORDER — FENOFIBRATE 160 MG PO TABS
160.0000 mg | ORAL_TABLET | Freq: Every day | ORAL | 6 refills | Status: AC
  Filled 2024-11-22: qty 30, 30d supply, fill #0

## 2024-11-29 ENCOUNTER — Other Ambulatory Visit (HOSPITAL_COMMUNITY): Payer: Self-pay

## 2024-12-27 ENCOUNTER — Encounter: Payer: Self-pay | Admitting: Gastroenterology
